# Patient Record
Sex: Male | Born: 1950 | Race: White | Hispanic: No | Marital: Married | Smoking: Never smoker
Health system: Southern US, Community
[De-identification: ages and names within clinical notes are randomized; demographics above are authoritative.]

## PROBLEM LIST (undated history)

## (undated) DIAGNOSIS — C6290 Malignant neoplasm of unspecified testis, unspecified whether descended or undescended: Secondary | ICD-10-CM

## (undated) DIAGNOSIS — N200 Calculus of kidney: Secondary | ICD-10-CM

## (undated) DIAGNOSIS — S86019A Strain of unspecified Achilles tendon, initial encounter: Secondary | ICD-10-CM

## (undated) DIAGNOSIS — C7800 Secondary malignant neoplasm of unspecified lung: Secondary | ICD-10-CM

## (undated) DIAGNOSIS — D1803 Hemangioma of intra-abdominal structures: Secondary | ICD-10-CM

## (undated) DIAGNOSIS — K5792 Diverticulitis of intestine, part unspecified, without perforation or abscess without bleeding: Secondary | ICD-10-CM

## (undated) DIAGNOSIS — N189 Chronic kidney disease, unspecified: Secondary | ICD-10-CM

## (undated) DIAGNOSIS — C4491 Basal cell carcinoma of skin, unspecified: Secondary | ICD-10-CM

## (undated) DIAGNOSIS — C801 Malignant (primary) neoplasm, unspecified: Secondary | ICD-10-CM

## (undated) DIAGNOSIS — Z923 Personal history of irradiation: Secondary | ICD-10-CM

## (undated) DIAGNOSIS — K635 Polyp of colon: Secondary | ICD-10-CM

## (undated) HISTORY — DX: Strain of unspecified achilles tendon, initial encounter: S86.019A

## (undated) HISTORY — DX: Calculus of kidney: N20.0

## (undated) HISTORY — DX: Malignant neoplasm of unspecified testis, unspecified whether descended or undescended: C62.90

## (undated) HISTORY — PX: HERNIA REPAIR: SHX51

## (undated) HISTORY — DX: Polyp of colon: K63.5

## (undated) HISTORY — DX: Chronic kidney disease, unspecified: N18.9

## (undated) HISTORY — DX: Secondary malignant neoplasm of unspecified lung: C78.00

## (undated) HISTORY — DX: Diverticulitis of intestine, part unspecified, without perforation or abscess without bleeding: K57.92

## (undated) HISTORY — DX: Personal history of irradiation: Z92.3

## (undated) HISTORY — DX: Hemangioma of intra-abdominal structures: D18.03

## (undated) HISTORY — DX: Basal cell carcinoma of skin, unspecified: C44.91

---

## 1971-04-01 DIAGNOSIS — Z923 Personal history of irradiation: Secondary | ICD-10-CM

## 1971-04-01 DIAGNOSIS — C6290 Malignant neoplasm of unspecified testis, unspecified whether descended or undescended: Secondary | ICD-10-CM

## 1971-04-01 HISTORY — PX: ORCHIECTOMY: SHX2116

## 1971-04-01 HISTORY — DX: Personal history of irradiation: Z92.3

## 1971-04-01 HISTORY — DX: Malignant neoplasm of unspecified testis, unspecified whether descended or undescended: C62.90

## 1997-11-10 ENCOUNTER — Ambulatory Visit (HOSPITAL_COMMUNITY): Admission: RE | Admit: 1997-11-10 | Discharge: 1997-11-10 | Payer: Self-pay | Admitting: Internal Medicine

## 2001-03-31 HISTORY — PX: OTHER SURGICAL HISTORY: SHX169

## 2002-03-31 DIAGNOSIS — N189 Chronic kidney disease, unspecified: Secondary | ICD-10-CM

## 2002-03-31 HISTORY — DX: Chronic kidney disease, unspecified: N18.9

## 2003-03-21 ENCOUNTER — Encounter: Admission: RE | Admit: 2003-03-21 | Discharge: 2003-03-21 | Payer: Self-pay | Admitting: Urology

## 2004-02-29 ENCOUNTER — Ambulatory Visit: Payer: Self-pay | Admitting: Internal Medicine

## 2004-03-11 ENCOUNTER — Ambulatory Visit: Payer: Self-pay | Admitting: Internal Medicine

## 2004-04-04 ENCOUNTER — Ambulatory Visit: Payer: Self-pay | Admitting: Internal Medicine

## 2004-05-14 ENCOUNTER — Ambulatory Visit: Payer: Self-pay | Admitting: Internal Medicine

## 2004-06-04 ENCOUNTER — Ambulatory Visit: Payer: Self-pay | Admitting: Internal Medicine

## 2004-10-29 ENCOUNTER — Ambulatory Visit (HOSPITAL_COMMUNITY): Admission: RE | Admit: 2004-10-29 | Discharge: 2004-10-29 | Payer: Self-pay | Admitting: Neurosurgery

## 2004-11-20 ENCOUNTER — Ambulatory Visit: Payer: Self-pay | Admitting: Internal Medicine

## 2005-03-28 ENCOUNTER — Ambulatory Visit: Payer: Self-pay | Admitting: Internal Medicine

## 2005-04-16 ENCOUNTER — Ambulatory Visit: Payer: Self-pay | Admitting: Internal Medicine

## 2005-04-24 ENCOUNTER — Ambulatory Visit: Payer: Self-pay | Admitting: Internal Medicine

## 2005-05-14 ENCOUNTER — Ambulatory Visit: Payer: Self-pay | Admitting: Internal Medicine

## 2005-11-26 ENCOUNTER — Ambulatory Visit: Payer: Self-pay | Admitting: Internal Medicine

## 2006-04-07 ENCOUNTER — Ambulatory Visit: Payer: Self-pay | Admitting: Sports Medicine

## 2006-04-29 ENCOUNTER — Ambulatory Visit: Payer: Self-pay | Admitting: Internal Medicine

## 2006-09-16 DIAGNOSIS — Z860100 Personal history of colon polyps, unspecified: Secondary | ICD-10-CM | POA: Insufficient documentation

## 2006-09-16 DIAGNOSIS — J45909 Unspecified asthma, uncomplicated: Secondary | ICD-10-CM | POA: Insufficient documentation

## 2006-09-16 DIAGNOSIS — E785 Hyperlipidemia, unspecified: Secondary | ICD-10-CM

## 2006-09-16 DIAGNOSIS — J309 Allergic rhinitis, unspecified: Secondary | ICD-10-CM | POA: Insufficient documentation

## 2006-09-26 ENCOUNTER — Emergency Department (HOSPITAL_COMMUNITY): Admission: EM | Admit: 2006-09-26 | Discharge: 2006-09-26 | Payer: Self-pay | Admitting: Emergency Medicine

## 2006-10-13 ENCOUNTER — Ambulatory Visit: Payer: Self-pay | Admitting: Internal Medicine

## 2007-04-12 ENCOUNTER — Telehealth (INDEPENDENT_AMBULATORY_CARE_PROVIDER_SITE_OTHER): Payer: Self-pay | Admitting: *Deleted

## 2007-08-24 ENCOUNTER — Ambulatory Visit: Payer: Self-pay | Admitting: Internal Medicine

## 2007-08-24 ENCOUNTER — Encounter (INDEPENDENT_AMBULATORY_CARE_PROVIDER_SITE_OTHER): Payer: Self-pay | Admitting: *Deleted

## 2007-08-24 DIAGNOSIS — G609 Hereditary and idiopathic neuropathy, unspecified: Secondary | ICD-10-CM | POA: Insufficient documentation

## 2007-08-24 LAB — CONVERTED CEMR LAB
Cholesterol, target level: 200 mg/dL
LDL Goal: 90 mg/dL

## 2007-08-29 LAB — CONVERTED CEMR LAB
ALT: 42 units/L (ref 0–53)
Albumin: 4.2 g/dL (ref 3.5–5.2)
Alkaline Phosphatase: 53 units/L (ref 39–117)
Basophils Absolute: 0 10*3/uL (ref 0.0–0.1)
Basophils Relative: 0.9 % (ref 0.0–1.0)
Calcium: 9.8 mg/dL (ref 8.4–10.5)
Chloride: 107 meq/L (ref 96–112)
HDL: 54.7 mg/dL (ref >39.0)
Hemoglobin: 14.1 g/dL (ref 13.0–17.0)
LDL Cholesterol: 116 mg/dL — ABNORMAL HIGH (ref 0–99)
Lymphocytes Relative: 22.3 % (ref 12.0–46.0)
MCV: 92.1 fL (ref 78.0–100.0)
Monocytes Absolute: 0.5 10*3/uL (ref 0.1–1.0)
Monocytes Relative: 10.1 % (ref 3.0–12.0)
PSA: 0.52 ng/mL (ref 0.10–4.00)
Potassium: 4.1 meq/L (ref 3.5–5.1)
RBC: 4.53 M/uL (ref 4.22–5.81)
TSH: 1.45 microintl units/mL (ref 0.35–5.50)
Total CHOL/HDL Ratio: 3.5
Total Protein: 7.1 g/dL (ref 6.0–8.3)
Triglycerides: 111 mg/dL (ref 0–149)
VLDL: 22 mg/dL (ref 0–40)
WBC: 5 10*3/uL (ref 4.5–10.5)

## 2007-08-30 ENCOUNTER — Encounter (INDEPENDENT_AMBULATORY_CARE_PROVIDER_SITE_OTHER): Payer: Self-pay | Admitting: *Deleted

## 2007-08-31 ENCOUNTER — Encounter: Payer: Self-pay | Admitting: Gastroenterology

## 2007-10-22 ENCOUNTER — Ambulatory Visit: Payer: Self-pay | Admitting: Internal Medicine

## 2007-10-22 DIAGNOSIS — F411 Generalized anxiety disorder: Secondary | ICD-10-CM | POA: Insufficient documentation

## 2007-11-18 ENCOUNTER — Telehealth: Payer: Self-pay | Admitting: Internal Medicine

## 2008-06-20 ENCOUNTER — Ambulatory Visit: Payer: Self-pay | Admitting: Gastroenterology

## 2008-07-04 ENCOUNTER — Encounter: Payer: Self-pay | Admitting: Gastroenterology

## 2008-07-04 ENCOUNTER — Ambulatory Visit: Payer: Self-pay | Admitting: Gastroenterology

## 2008-07-06 ENCOUNTER — Encounter: Payer: Self-pay | Admitting: Gastroenterology

## 2008-10-17 ENCOUNTER — Ambulatory Visit: Payer: Self-pay | Admitting: Internal Medicine

## 2008-10-17 DIAGNOSIS — Z85828 Personal history of other malignant neoplasm of skin: Secondary | ICD-10-CM | POA: Insufficient documentation

## 2008-10-17 DIAGNOSIS — K5730 Diverticulosis of large intestine without perforation or abscess without bleeding: Secondary | ICD-10-CM | POA: Insufficient documentation

## 2008-10-17 DIAGNOSIS — C6290 Malignant neoplasm of unspecified testis, unspecified whether descended or undescended: Secondary | ICD-10-CM

## 2008-10-17 LAB — CONVERTED CEMR LAB: LDL Goal: 100 mg/dL

## 2008-10-22 LAB — CONVERTED CEMR LAB
AST: 35 units/L (ref 0–37)
Albumin: 4.1 g/dL (ref 3.5–5.2)
CO2: 29 meq/L (ref 19–32)
Chloride: 105 meq/L (ref 96–112)
Glucose, Bld: 97 mg/dL (ref 70–99)
HDL: 54.3 mg/dL (ref >39.00)
Lymphocytes Relative: 23.9 % (ref 12.0–46.0)
Lymphs Abs: 1.4 10*3/uL (ref 0.7–4.0)
MCV: 91.2 fL (ref 78.0–100.0)
Neutro Abs: 3.2 10*3/uL (ref 1.4–7.7)
Neutrophils Relative %: 54.8 % (ref 43.0–77.0)
PSA: 0.45 ng/mL (ref 0.10–4.00)
Sodium: 140 meq/L (ref 135–145)
TSH: 2.36 microintl units/mL (ref 0.35–5.50)
Total CHOL/HDL Ratio: 3
WBC: 6 10*3/uL (ref 4.5–10.5)

## 2008-10-23 ENCOUNTER — Encounter (INDEPENDENT_AMBULATORY_CARE_PROVIDER_SITE_OTHER): Payer: Self-pay | Admitting: *Deleted

## 2009-02-13 ENCOUNTER — Encounter: Payer: Self-pay | Admitting: Internal Medicine

## 2009-10-18 ENCOUNTER — Ambulatory Visit: Payer: Self-pay | Admitting: Internal Medicine

## 2009-10-18 DIAGNOSIS — D3705 Neoplasm of uncertain behavior of pharynx: Secondary | ICD-10-CM

## 2009-10-18 DIAGNOSIS — D3709 Neoplasm of uncertain behavior of other specified sites of the oral cavity: Secondary | ICD-10-CM

## 2009-10-18 DIAGNOSIS — D3701 Neoplasm of uncertain behavior of lip: Secondary | ICD-10-CM | POA: Insufficient documentation

## 2009-10-19 ENCOUNTER — Telehealth (INDEPENDENT_AMBULATORY_CARE_PROVIDER_SITE_OTHER): Payer: Self-pay | Admitting: *Deleted

## 2009-10-19 LAB — CONVERTED CEMR LAB
ALT: 73 units/L — ABNORMAL HIGH (ref 0–53)
AST: 186 units/L — ABNORMAL HIGH (ref 0–37)
Albumin: 4.6 g/dL (ref 3.5–5.2)
BUN: 13 mg/dL (ref 6–23)
Bilirubin, Direct: 0.2 mg/dL (ref 0.0–0.3)
CO2: 29 meq/L (ref 19–32)
Chloride: 105 meq/L (ref 96–112)
GFR calc non Af Amer: 101.67 mL/min (ref >60)
Glucose, Bld: 97 mg/dL (ref 70–99)
HDL: 59.9 mg/dL (ref >39.00)
Hemoglobin: 14.8 g/dL (ref 13.0–17.0)
Lymphs Abs: 1.4 10*3/uL (ref 0.7–4.0)
Monocytes Relative: 10.1 % (ref 3.0–12.0)
Neutro Abs: 3.2 10*3/uL (ref 1.4–7.7)
Neutrophils Relative %: 55.4 % (ref 43.0–77.0)
PSA: 0.52 ng/mL (ref 0.10–4.00)
Platelets: 220 10*3/uL (ref 150.0–400.0)
RBC: 4.57 M/uL (ref 4.22–5.81)
Sodium: 142 meq/L (ref 135–145)
Triglycerides: 101 mg/dL (ref 0.0–149.0)
VLDL: 20.2 mg/dL (ref 0.0–40.0)

## 2009-10-24 ENCOUNTER — Encounter: Payer: Self-pay | Admitting: Internal Medicine

## 2009-10-25 ENCOUNTER — Ambulatory Visit: Payer: Self-pay | Admitting: Internal Medicine

## 2009-10-26 LAB — CONVERTED CEMR LAB
ALT: 48 units/L (ref 0–53)
Albumin: 4.2 g/dL (ref 3.5–5.2)
Total Bilirubin: 0.9 mg/dL (ref 0.3–1.2)

## 2009-11-06 DIAGNOSIS — C801 Malignant (primary) neoplasm, unspecified: Secondary | ICD-10-CM

## 2009-11-06 HISTORY — DX: Malignant (primary) neoplasm, unspecified: C80.1

## 2009-11-15 ENCOUNTER — Ambulatory Visit (HOSPITAL_COMMUNITY): Admission: RE | Admit: 2009-11-15 | Discharge: 2009-11-15 | Payer: Self-pay | Admitting: Otolaryngology

## 2009-11-26 ENCOUNTER — Encounter: Payer: Self-pay | Admitting: Internal Medicine

## 2009-11-26 ENCOUNTER — Ambulatory Visit
Admission: RE | Admit: 2009-11-26 | Discharge: 2009-12-24 | Payer: Self-pay | Source: Home / Self Care | Admitting: Radiation Oncology

## 2009-11-28 ENCOUNTER — Encounter: Payer: Self-pay | Admitting: Internal Medicine

## 2009-11-29 ENCOUNTER — Encounter: Admission: AD | Admit: 2009-11-29 | Discharge: 2009-11-29 | Payer: Self-pay | Admitting: Dentistry

## 2009-11-29 ENCOUNTER — Ambulatory Visit: Payer: Self-pay | Admitting: Dentistry

## 2009-11-30 ENCOUNTER — Ambulatory Visit: Payer: Self-pay | Admitting: Oncology

## 2009-11-30 ENCOUNTER — Encounter: Payer: Self-pay | Admitting: Internal Medicine

## 2009-11-30 LAB — COMPREHENSIVE METABOLIC PANEL
ALT: 26 U/L (ref 0–53)
AST: 22 U/L (ref 0–37)
Albumin: 4.1 g/dL (ref 3.5–5.2)
Alkaline Phosphatase: 57 U/L (ref 39–117)
BUN: 13 mg/dL (ref 6–23)
Chloride: 105 mEq/L (ref 96–112)
Glucose, Bld: 103 mg/dL — ABNORMAL HIGH (ref 70–99)
Sodium: 141 mEq/L (ref 135–145)
Total Bilirubin: 0.5 mg/dL (ref 0.3–1.2)

## 2009-11-30 LAB — CBC WITH DIFFERENTIAL/PLATELET
Eosinophils Absolute: 0.7 10*3/uL — ABNORMAL HIGH (ref 0.0–0.5)
HGB: 14.3 g/dL (ref 13.0–17.1)
LYMPH%: 25.7 % (ref 14.0–49.0)
NEUT#: 2.9 10*3/uL (ref 1.5–6.5)
Platelets: 227 10*3/uL (ref 140–400)
RDW: 12.8 % (ref 11.0–14.6)

## 2009-12-18 LAB — COMPREHENSIVE METABOLIC PANEL
ALT: 33 U/L (ref 0–53)
Albumin: 4.2 g/dL (ref 3.5–5.2)
Alkaline Phosphatase: 60 U/L (ref 39–117)
Potassium: 4.2 mEq/L (ref 3.5–5.3)
Sodium: 138 mEq/L (ref 135–145)
Total Protein: 7.5 g/dL (ref 6.0–8.3)

## 2009-12-18 LAB — CBC WITH DIFFERENTIAL/PLATELET
BASO%: 0.9 % (ref 0.0–2.0)
Basophils Absolute: 0.1 10*3/uL (ref 0.0–0.1)
HCT: 43.8 % (ref 38.4–49.9)
HGB: 15.1 g/dL (ref 13.0–17.1)
MCHC: 34.5 g/dL (ref 32.0–36.0)
MCV: 89.4 fL (ref 79.3–98.0)
MONO%: 10.9 % (ref 0.0–14.0)
NEUT#: 2.7 10*3/uL (ref 1.5–6.5)
Platelets: 187 10*3/uL (ref 140–400)
RBC: 4.9 10*6/uL (ref 4.20–5.82)
lymph#: 1.6 10*3/uL (ref 0.9–3.3)

## 2009-12-18 LAB — MAGNESIUM: Magnesium: 2.2 mg/dL (ref 1.5–2.5)

## 2009-12-25 ENCOUNTER — Ambulatory Visit: Admission: RE | Admit: 2009-12-25 | Discharge: 2010-02-07 | Payer: Self-pay | Admitting: Radiation Oncology

## 2009-12-26 LAB — BASIC METABOLIC PANEL
BUN: 20 mg/dL (ref 6–23)
Creatinine, Ser: 1.26 mg/dL (ref 0.40–1.50)
Sodium: 131 mEq/L — ABNORMAL LOW (ref 135–145)

## 2009-12-31 ENCOUNTER — Ambulatory Visit: Payer: Self-pay | Admitting: Oncology

## 2009-12-31 ENCOUNTER — Ambulatory Visit (HOSPITAL_COMMUNITY): Admission: RE | Admit: 2009-12-31 | Discharge: 2009-12-31 | Payer: Self-pay | Admitting: Radiation Oncology

## 2010-01-01 LAB — BASIC METABOLIC PANEL
CO2: 26 mEq/L (ref 19–32)
Calcium: 8.8 mg/dL (ref 8.4–10.5)
Glucose, Bld: 100 mg/dL — ABNORMAL HIGH (ref 70–99)
Potassium: 4.1 mEq/L (ref 3.5–5.3)

## 2010-01-08 LAB — CBC WITH DIFFERENTIAL/PLATELET
EOS%: 5.9 % (ref 0.0–7.0)
HCT: 34.5 % — ABNORMAL LOW (ref 38.4–49.9)
LYMPH%: 20.2 % (ref 14.0–49.0)
MCHC: 35.3 g/dL (ref 32.0–36.0)
MCV: 88.2 fL (ref 79.3–98.0)
MONO#: 0.3 10*3/uL (ref 0.1–0.9)
MONO%: 18.2 % — ABNORMAL HIGH (ref 0.0–14.0)
NEUT#: 0.9 10*3/uL — ABNORMAL LOW (ref 1.5–6.5)
Platelets: 208 10*3/uL (ref 140–400)
WBC: 1.7 10*3/uL — ABNORMAL LOW (ref 4.0–10.3)

## 2010-01-08 LAB — MAGNESIUM: Magnesium: 1.8 mg/dL (ref 1.5–2.5)

## 2010-01-08 LAB — COMPREHENSIVE METABOLIC PANEL
BUN: 11 mg/dL (ref 6–23)
CO2: 27 mEq/L (ref 19–32)
Glucose, Bld: 93 mg/dL (ref 70–99)
Sodium: 136 mEq/L (ref 135–145)
Total Bilirubin: 0.5 mg/dL (ref 0.3–1.2)
Total Protein: 6.5 g/dL (ref 6.0–8.3)

## 2010-01-10 ENCOUNTER — Encounter
Admission: RE | Admit: 2010-01-10 | Discharge: 2010-03-27 | Payer: Self-pay | Source: Home / Self Care | Attending: Radiation Oncology | Admitting: Radiation Oncology

## 2010-01-15 LAB — CBC WITH DIFFERENTIAL/PLATELET
BASO%: 0.5 % (ref 0.0–2.0)
Basophils Absolute: 0 10*3/uL (ref 0.0–0.1)
EOS%: 2.2 % (ref 0.0–7.0)
Eosinophils Absolute: 0.1 10*3/uL (ref 0.0–0.5)
HGB: 12.2 g/dL — ABNORMAL LOW (ref 13.0–17.1)
MCHC: 35.8 g/dL (ref 32.0–36.0)
MONO#: 0.4 10*3/uL (ref 0.1–0.9)
MONO%: 9.8 % (ref 0.0–14.0)
NEUT%: 78.7 % — ABNORMAL HIGH (ref 39.0–75.0)
Platelets: 225 10*3/uL (ref 140–400)
RDW: 12.3 % (ref 11.0–14.6)

## 2010-01-15 LAB — COMPREHENSIVE METABOLIC PANEL
ALT: 27 U/L (ref 0–53)
Albumin: 3.6 g/dL (ref 3.5–5.2)
BUN: 11 mg/dL (ref 6–23)
Calcium: 9.6 mg/dL (ref 8.4–10.5)
Creatinine, Ser: 0.88 mg/dL (ref 0.40–1.50)
Glucose, Bld: 120 mg/dL — ABNORMAL HIGH (ref 70–99)
Potassium: 4.3 mEq/L (ref 3.5–5.3)
Sodium: 137 mEq/L (ref 135–145)

## 2010-01-21 LAB — BASIC METABOLIC PANEL
CO2: 27 mEq/L (ref 19–32)
Calcium: 9.5 mg/dL (ref 8.4–10.5)
Chloride: 98 mEq/L (ref 96–112)

## 2010-01-21 LAB — MAGNESIUM: Magnesium: 2 mg/dL (ref 1.5–2.5)

## 2010-01-29 LAB — BASIC METABOLIC PANEL
BUN: 13 mg/dL (ref 6–23)
CO2: 30 mEq/L (ref 19–32)
Chloride: 98 mEq/L (ref 96–112)
Creatinine, Ser: 0.82 mg/dL (ref 0.40–1.50)
Glucose, Bld: 102 mg/dL — ABNORMAL HIGH (ref 70–99)
Sodium: 137 mEq/L (ref 135–145)

## 2010-01-30 ENCOUNTER — Ambulatory Visit: Payer: Self-pay | Admitting: Oncology

## 2010-02-05 LAB — COMPREHENSIVE METABOLIC PANEL
AST: 15 U/L (ref 0–37)
CO2: 30 mEq/L (ref 19–32)
Calcium: 9.3 mg/dL (ref 8.4–10.5)
Chloride: 97 mEq/L (ref 96–112)
Creatinine, Ser: 0.83 mg/dL (ref 0.40–1.50)
Total Bilirubin: 0.3 mg/dL (ref 0.3–1.2)

## 2010-02-05 LAB — CBC WITH DIFFERENTIAL/PLATELET
EOS%: 3.3 % (ref 0.0–7.0)
HCT: 27.7 % — ABNORMAL LOW (ref 38.4–49.9)
HGB: 9.9 g/dL — ABNORMAL LOW (ref 13.0–17.1)
LYMPH%: 8.3 % — ABNORMAL LOW (ref 14.0–49.0)
MCHC: 35.8 g/dL (ref 32.0–36.0)
MONO#: 0.3 10*3/uL (ref 0.1–0.9)
NEUT%: 65.4 % (ref 39.0–75.0)
WBC: 1.5 10*3/uL — ABNORMAL LOW (ref 4.0–10.3)

## 2010-02-05 LAB — MAGNESIUM: Magnesium: 2 mg/dL (ref 1.5–2.5)

## 2010-02-11 ENCOUNTER — Ambulatory Visit
Admission: RE | Admit: 2010-02-11 | Discharge: 2010-02-11 | Payer: Self-pay | Source: Home / Self Care | Admitting: Radiation Oncology

## 2010-02-19 ENCOUNTER — Encounter: Payer: Self-pay | Admitting: Internal Medicine

## 2010-03-04 ENCOUNTER — Ambulatory Visit (HOSPITAL_COMMUNITY)
Admission: RE | Admit: 2010-03-04 | Discharge: 2010-03-04 | Payer: Self-pay | Source: Home / Self Care | Admitting: Radiation Oncology

## 2010-03-04 ENCOUNTER — Ambulatory Visit: Payer: Self-pay | Admitting: Oncology

## 2010-03-06 LAB — COMPREHENSIVE METABOLIC PANEL
ALT: 16 U/L (ref 0–53)
Albumin: 4.1 g/dL (ref 3.5–5.2)
Alkaline Phosphatase: 61 U/L (ref 39–117)
BUN: 17 mg/dL (ref 6–23)
CO2: 31 mEq/L (ref 19–32)
Calcium: 9.6 mg/dL (ref 8.4–10.5)
Creatinine, Ser: 0.81 mg/dL (ref 0.40–1.50)
Sodium: 137 mEq/L (ref 135–145)

## 2010-03-06 LAB — CBC WITH DIFFERENTIAL/PLATELET
EOS%: 2.6 % (ref 0.0–7.0)
Eosinophils Absolute: 0.1 10*3/uL (ref 0.0–0.5)
MCH: 33.1 pg (ref 27.2–33.4)
MCHC: 35.5 g/dL (ref 32.0–36.0)
MCV: 93.3 fL (ref 79.3–98.0)
Platelets: 286 10*3/uL (ref 140–400)
RDW: 16.3 % — ABNORMAL HIGH (ref 11.0–14.6)
lymph#: 0.4 10*3/uL — ABNORMAL LOW (ref 0.9–3.3)

## 2010-03-07 ENCOUNTER — Encounter: Payer: Self-pay | Admitting: Internal Medicine

## 2010-04-08 ENCOUNTER — Ambulatory Visit (HOSPITAL_BASED_OUTPATIENT_CLINIC_OR_DEPARTMENT_OTHER): Payer: BC Managed Care – PPO | Admitting: Oncology

## 2010-04-10 LAB — CBC WITH DIFFERENTIAL/PLATELET
BASO%: 0.4 % (ref 0.0–2.0)
Basophils Absolute: 0 10*3/uL (ref 0.0–0.1)
EOS%: 6 % (ref 0.0–7.0)
Eosinophils Absolute: 0.3 10*3/uL (ref 0.0–0.5)
HCT: 32.1 % — ABNORMAL LOW (ref 38.4–49.9)
HGB: 11 g/dL — ABNORMAL LOW (ref 13.0–17.1)
LYMPH%: 13.8 % — ABNORMAL LOW (ref 14.0–49.0)
MCH: 31.6 pg (ref 27.2–33.4)
MCHC: 34.3 g/dL (ref 32.0–36.0)
MCV: 92.2 fL (ref 79.3–98.0)
MONO#: 0.4 10*3/uL (ref 0.1–0.9)
MONO%: 7.5 % (ref 0.0–14.0)
NEUT#: 3.4 10*3/uL (ref 1.5–6.5)
NEUT%: 72.3 % (ref 39.0–75.0)
Platelets: 165 10*3/uL (ref 140–400)
RBC: 3.48 10*6/uL — ABNORMAL LOW (ref 4.20–5.82)
RDW: 12.5 % (ref 11.0–14.6)
WBC: 4.7 10*3/uL (ref 4.0–10.3)
lymph#: 0.6 10*3/uL — ABNORMAL LOW (ref 0.9–3.3)

## 2010-04-10 LAB — COMPREHENSIVE METABOLIC PANEL
ALT: 20 U/L (ref 0–53)
AST: 16 U/L (ref 0–37)
Albumin: 4.5 g/dL (ref 3.5–5.2)
Alkaline Phosphatase: 47 U/L (ref 39–117)
BUN: 13 mg/dL (ref 6–23)
CO2: 27 mEq/L (ref 19–32)
Calcium: 10.1 mg/dL (ref 8.4–10.5)
Chloride: 101 mEq/L (ref 96–112)
Creatinine, Ser: 0.81 mg/dL (ref 0.40–1.50)
Glucose, Bld: 106 mg/dL — ABNORMAL HIGH (ref 70–99)
Potassium: 4.9 mEq/L (ref 3.5–5.3)
Sodium: 138 mEq/L (ref 135–145)
Total Bilirubin: 0.4 mg/dL (ref 0.3–1.2)
Total Protein: 6.5 g/dL (ref 6.0–8.3)

## 2010-04-11 ENCOUNTER — Encounter: Payer: Self-pay | Admitting: Internal Medicine

## 2010-04-18 ENCOUNTER — Other Ambulatory Visit: Payer: Self-pay | Admitting: Oncology

## 2010-04-18 DIAGNOSIS — C099 Malignant neoplasm of tonsil, unspecified: Secondary | ICD-10-CM

## 2010-04-23 ENCOUNTER — Encounter
Admission: RE | Admit: 2010-04-23 | Discharge: 2010-04-30 | Payer: Self-pay | Source: Home / Self Care | Attending: Radiation Oncology | Admitting: Radiation Oncology

## 2010-04-30 NOTE — Letter (Signed)
Summary: [facility]  [facility]   Imported By: Lanelle Bal 12/19/2009 10:02:58  _____________________________________________________________________  External Attachment:    Type:   Image     Comment:   External Document

## 2010-04-30 NOTE — Assessment & Plan Note (Signed)
Summary: cpx/kdc   Vital Signs:  Patient profile:   60 year old male Height:      69.5 inches Weight:      200.6 pounds BMI:     29.30 Temp:     98.3 degrees F oral Pulse rate:   60 / minute Resp:     14 per minute BP sitting:   118 / 80  (left arm) Cuff size:   large  Vitals Entered By: Shonna Chock CMA (October 18, 2009 9:55 AM)  CC: Lipid Management   CC:  Lipid Management.  History of Present Illness: Mr. Gary Sexton is here for a physical; he does have a sore throat over 4 weeks.    The patient denies nasal congestion, purulent nasal discharge, productive cough, or  earache @ present.  The patient denies fever, dyspnea, wheezing, rash, vomiting, and diarrhea.  The patient also reports perennial  itchy watery eyes and sneezing.  The patient denies  frontal headache, muscle aches, or  severe fatigue.  Risk factors for Strep sinusitis include L  tender cervical  adenopathy.  The patient denies the following risk factors for Strep sinusitis: facial pain  or  tooth pain.  Rx: none to date.  Lipid Management History:      Positive NCEP/ATP III risk factors include male age 41 years old or older and family history for ischemic heart disease (males less than 81 years old).  Negative NCEP/ATP III risk factors include non-diabetic, non-tobacco-user status, non-hypertensive, no ASHD (atherosclerotic heart disease), no prior stroke/TIA, no peripheral vascular disease, and no history of aortic aneurysm.     Preventive Screening-Counseling & Management  Caffeine-Diet-Exercise     Does Patient Exercise: yes  Current Medications (verified): 1)  Vytorin 10-20 Mg  Tabs (Ezetimibe-Simvastatin) .... At Bedtime 2)  Advair Diskus 100-50 Mcg/dose  Misc (Fluticasone-Salmeterol) .... As Directed 3)  Multivitamin 4)  Asa 325mg  .... Alternates With 1/2 Tab 5)  Cialis 20 Mg  Tabs (Tadalafil) .... Take One As Directed Prn 6)  Fluticasone Propionate 50 Mcg/act  Susp (Fluticasone Propionate) .Marland Kitchen.. 1 Spray Two  Times A Day Prn  Allergies (verified): No Known Drug Allergies  Past History:  Past Medical History: Allergic rhinitis Asthma Colonic polyps,PMH  of Hyperlipidemia: NMR Lipoprofile:LDL 130(1812/1180), HDL 57, TG 101. LDL goal = < 100. Framingham Study LDL goal = < 130. 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 adenomatous , negative  2005 & 2010, hyperplastic polyp , Dr Russella Dar , due 2020 Inguinal herniorrhaphy, bilateral Renal calculi, S/P cystoscopy  Family History: Father: CVA @ 3; MI pre 55,CHF Mother: d [place]'s, breast  cancer Siblings: bro: thoracic aneurysm; bro: skin cancer; MGF: lung  cancer  Social History: Never Smoked No diet Married Alcohol use-yes:socially Occupation:Director of R&D Regular exercise-yes: aerobics 2-3 X/week, weights  Review of Systems General:  Denies chills and sweats. Eyes:  Denies discharge, eye pain, and red eye. ENT:  Denies difficulty swallowing and hoarseness; see HPI. Tinnitus & slight hearing loss post flight to [place].. CV:  Denies chest pain or discomfort, leg cramps with exertion, palpitations, shortness  of breath with exertion, swelling of feet, and swelling of hands. Resp:  Denies chest pain with inspiration and sputum productive. GI:  Denies abdominal pain, bloody stools, constipation, dark tarry stools, diarrhea, and indigestion. GU:  Denies discharge, dysuria, and hematuria. MS:  Denies low back pain, mid back pain, and thoracic pain; See PMH. Derm:  Denies  changes in nail beds, dryness, and hair loss. Neuro:  Numbness RLE ,?  lumbar radiculopathy. Evaluated @ [facility]. Psych:  Denies anxiety and depression. Endo:  Denies cold intolerance, excessive hunger, excessive thirst, excessive urination, and heat intolerance. Heme:  Denies abnormal bruising and bleeding.  Physical Exam  General:  well-nourished,in no acute distress; alert,appropriate and cooperative throughout examination Head:  Normocephalic and atraumatic without obvious abnormalities. No apparent alopecia  Eyes:  No corneal or conjunctival inflammation noted.  Perrla. Funduscopic exam benign, without hemorrhages, exudates or papilledema.  Ears:  External ear exam shows no significant lesions or deformities.  Otoscopic examination reveals clear canals, tympanic membranes are intact bilaterally without bulging, retraction, inflammation or discharge. Hearing is grossly normal bilaterally. R TM dull Nose:  External nasal examination shows no deformity or inflammation. Nasal mucosa are pink and moist without lesions or exudates. Mouth:   Teeth in good repair. L tonsil hypertropied with ? papular changes.   Neck:  No deformities, masses, or tenderness noted. Lungs:  Normal respiratory effort, chest expands symmetrically. Lungs are clear to auscultation, no crackles or wheezes. Heart:  regular rhythm, no murmur, no gallop, no rub, no JVD, no HJR, and bradycardia.   Abdomen:  Bowel sounds positive,abdomen soft and non-tender without masses, organomegaly or hernias noted. Op sites healing Rectal:  External  hemorrhoidal tags  noted. Normal sphincter tone. No rectal masses or tenderness. R buttocks atrophy Genitalia:  R testis absent. No scrotal masses or lesions. No penis lesions or urethral discharge. Prostate:  Prostate gland firm and smooth, no enlargement, nodularity, tenderness, mass, asymmetry or induration. Msk:  No deformity or scoliosis noted of thoracic or lumbar spine.   Pulses:  R and L  carotid,radial,dorsalis pedis and posterior tibial pulses are full and equal bilaterally Extremities:  No clubbing, cyanosis, edema. Flexion contractures of toes R foot. Atrophy of R calf Neurologic:  alert & oriented X3 and DTRs symmetrical and normal.   Skin:  Intact without suspicious lesions or rashes ( see abdominal op sites) Cervical Nodes:  Single L submandibular LN Axillary Nodes:  No palpable lymphadenopathy Inguinal Nodes:  No significant adenopathy Psych:  memory intact for recent and remote, normally interactive, and good eye contact.     Impression & Recommendations:  Problem # 1:  ROUTINE GENERAL MEDICAL EXAM@HEALTH  CARE FACL (ICD-V70.0)  Orders: EKG w/ Interpretation (93000) Venipuncture (98119) TLB-Lipid Panel (80061-LIPID) TLB-BMP (Basic Metabolic Panel-BMET) (80048-METABOL) TLB-CBC Platelet - w/Differential (85025-CBCD) TLB-Hepatic/Liver Function Pnl (80076-HEPATIC) TLB-TSH (Thyroid Stimulating Hormone) (84443-TSH) TLB-PSA (Prostate Specific Antigen) (84153-PSA) Specimen Handling (14782)  Problem # 2:  NEOPLASM UNCERTAIN BHV LIP ORAL CAVITY&PHARYNX (ICD-235.1)  Presentation as ST X 4 weeks; single cervical LN. ENT consultation recommended  Orders: ENT Referral (ENT)  Problem # 3:  PERIPHERAL NEUROPATHY (ICD-356.9) ?Lumbar  Problem # 4:  HYPERLIPIDEMIA (ICD-272.4)  His updated medication list for this problem includes:    Vytorin 10-20 Mg Tabs (Ezetimibe-simvastatin) .Marland Kitchen... At bedtime  Complete Medication List: 1)  Vytorin 10-20 Mg Tabs (Ezetimibe-simvastatin) .... At bedtime 2)  Advair Diskus 100-50 Mcg/dose Misc (Fluticasone-salmeterol) .... As directed 3)  Multivitamin  4)  Asa 325mg   .... Alternates with  1/2 tab 5)  Cialis 20 Mg Tabs (Tadalafil) .... Take one as directed prn 6)  Fluticasone Propionate 50 Mcg/act Susp (Fluticasone propionate) .Marland Kitchen.. 1 spray two times a day prn 7)  Amoxicillin-pot Clavulanate 875-125 Mg Tabs (Amoxicillin-pot clavulanate)  .Marland Kitchen.. 1 every 12 hrs with a meal  Lipid Assessment/Plan:      Based on NCEP/ATP III, the patient's risk factor category is "2 or more risk factors and a calculated 10 year CAD risk of < 20%".  The patient's lipid goals are as follows: Total cholesterol goal is 200; LDL cholesterol goal is 100; HDL cholesterol goal is 40; Triglyceride goal is 150.  His LDL cholesterol goal has not been met.  Secondary causes for hyperlipidemia have been ruled out.  He has been counseled on adjunctive measures for lowering his cholesterol and has been provided with dietary instructions.    Patient Instructions: 1)  ENT consult will be scheduled ASAP Prescriptions: AMOXICILLIN-POT CLAVULANATE 875-125 MG TABS (AMOXICILLIN-POT CLAVULANATE) 1 every 12 hrs with a meal  #20 x 0   Entered and Authorized by:   Marga Melnick MD   Signed by:   Marga Melnick MD on 10/18/2009   Method used:   Faxed to ...       [facility] (retail)       [address]       [place], [place]  [address]       Ph: [phone] or [phone]       Fax: [phone]   RxID:   [id number]    Immunization History:  Tetanus/Td Immunization History:    Tetanus/Td:  historical (08/30/2003)

## 2010-04-30 NOTE — Letter (Signed)
Summary: [facility]  [facility]   Imported By: Lanelle Bal 12/13/2009 08:24:59  _____________________________________________________________________  External Attachment:    Type:   Image     Comment:   External Document

## 2010-04-30 NOTE — Letter (Signed)
Summary: [facility]  [facility]   Imported By: Lanelle Bal 12/24/2009 12:43:26  _____________________________________________________________________  External Attachment:    Type:   Image     Comment:   External Document

## 2010-04-30 NOTE — Letter (Signed)
Summary: [facility]  [facility]   Imported By: Lanelle Bal 03/04/2010 13:03:01  _____________________________________________________________________  External Attachment:    Type:   Image     Comment:   External Document

## 2010-04-30 NOTE — Progress Notes (Signed)
Summary: Lab Results  Phone Note Outgoing Call Call back at Home Phone [phone]   Call placed by: Shonna Chock CMA,  October 19, 2009 4:23 PM Call placed to: Patient Summary of Call: Left detailed message on VM with results below:  Stop Vytorin & avoid vitamin A, Tylernol & alcohol. Repeat fasting liver panel in 1 week (790.4). Allergic cell count high , blood count otherwise normal. LDL goal = < 100, but we'll discuss possible cholesterol med changes after liver  & throat issues clarified. ENT consult has been requested. Hopp  Patient to call to schedule appointment to recheck Hepatic profile in 1 week, patient also to call if any questions or concerns (copy of labs mailed).Shonna Chock CMA  October 19, 2009 4:24 PM

## 2010-04-30 NOTE — Consult Note (Signed)
Summary: [facility]  [facility]   Imported By: Lanelle Bal 11/05/2009 10:18:36  _____________________________________________________________________  External Attachment:    Type:   Image     Comment:   External Document

## 2010-05-02 NOTE — Letter (Signed)
Summary: [facility]  [facility]   Imported By: Lanelle Bal 03/21/2010 09:17:59  _____________________________________________________________________  External Attachment:    Type:   Image     Comment:   External Document

## 2010-05-06 ENCOUNTER — Encounter (HOSPITAL_COMMUNITY): Payer: Self-pay

## 2010-05-06 ENCOUNTER — Encounter (HOSPITAL_COMMUNITY)
Admission: RE | Admit: 2010-05-06 | Discharge: 2010-05-06 | Disposition: A | Discharge status: Home / Self Care | Payer: BC Managed Care – PPO | Source: Ambulatory Visit | Attending: Oncology | Admitting: Oncology

## 2010-05-06 DIAGNOSIS — C091 Malignant neoplasm of tonsillar pillar (anterior) (posterior): Secondary | ICD-10-CM | POA: Insufficient documentation

## 2010-05-06 DIAGNOSIS — C099 Malignant neoplasm of tonsil, unspecified: Secondary | ICD-10-CM

## 2010-05-06 DIAGNOSIS — E785 Hyperlipidemia, unspecified: Secondary | ICD-10-CM | POA: Insufficient documentation

## 2010-05-06 HISTORY — DX: Malignant (primary) neoplasm, unspecified: C80.1

## 2010-05-06 LAB — GLUCOSE, CAPILLARY: Glucose-Capillary: 100 mg/dL — ABNORMAL HIGH (ref 70–99)

## 2010-05-06 MED ORDER — FLUDEOXYGLUCOSE F - 18 (FDG) INJECTION: 17.3000 | Freq: Once | INTRAVENOUS | Status: DC | PRN

## 2010-05-08 ENCOUNTER — Encounter (HOSPITAL_BASED_OUTPATIENT_CLINIC_OR_DEPARTMENT_OTHER): Payer: BC Managed Care – PPO | Admitting: Oncology

## 2010-05-08 DIAGNOSIS — T451X5A Adverse effect of antineoplastic and immunosuppressive drugs, initial encounter: Secondary | ICD-10-CM

## 2010-05-08 DIAGNOSIS — C099 Malignant neoplasm of tonsil, unspecified: Secondary | ICD-10-CM

## 2010-05-08 DIAGNOSIS — R5383 Other fatigue: Secondary | ICD-10-CM

## 2010-05-08 DIAGNOSIS — D6481 Anemia due to antineoplastic chemotherapy: Secondary | ICD-10-CM

## 2010-05-08 LAB — CBC WITH DIFFERENTIAL/PLATELET
Basophils Absolute: 0 10*3/uL (ref 0.0–0.1)
EOS%: 4.9 % (ref 0.0–7.0)
HGB: 11.9 g/dL — ABNORMAL LOW (ref 13.0–17.1)
LYMPH%: 11.6 % — ABNORMAL LOW (ref 14.0–49.0)
MCHC: 35.3 g/dL (ref 32.0–36.0)
MCV: 93.3 fL (ref 79.3–98.0)
NEUT#: 2.9 10*3/uL (ref 1.5–6.5)
RDW: 12.3 % (ref 11.0–14.6)
WBC: 3.9 10*3/uL — ABNORMAL LOW (ref 4.0–10.3)

## 2010-05-08 LAB — COMPREHENSIVE METABOLIC PANEL
ALT: 16 U/L (ref 0–53)
Albumin: 4.4 g/dL (ref 3.5–5.2)
Chloride: 102 mEq/L (ref 96–112)
Potassium: 4.4 mEq/L (ref 3.5–5.3)
Sodium: 136 mEq/L (ref 135–145)
Total Bilirubin: 0.4 mg/dL (ref 0.3–1.2)
Total Protein: 6.5 g/dL (ref 6.0–8.3)

## 2010-05-08 NOTE — Letter (Signed)
Summary: [facility]  [facility]   Imported By: Lanelle Bal 04/29/2010 09:40:01  _____________________________________________________________________  External Attachment:    Type:   Image     Comment:   External Document

## 2010-05-22 ENCOUNTER — Ambulatory Visit: Payer: BC Managed Care – PPO | Attending: Radiation Oncology | Admitting: Radiation Oncology

## 2010-05-22 ENCOUNTER — Encounter: Payer: Self-pay | Admitting: Internal Medicine

## 2010-06-04 ENCOUNTER — Ambulatory Visit: Payer: BC Managed Care – PPO | Attending: Oncology

## 2010-06-04 DIAGNOSIS — R1310 Dysphagia, unspecified: Secondary | ICD-10-CM | POA: Insufficient documentation

## 2010-06-04 DIAGNOSIS — IMO0001 Reserved for inherently not codable concepts without codable children: Secondary | ICD-10-CM | POA: Insufficient documentation

## 2010-06-04 DIAGNOSIS — C099 Malignant neoplasm of tonsil, unspecified: Secondary | ICD-10-CM | POA: Insufficient documentation

## 2010-06-06 NOTE — Letter (Signed)
Summary: [facility]  [facility]   Imported By: Maryln Gottron 05/28/2010 12:22:25  _____________________________________________________________________  External Attachment:    Type:   Image     Comment:   External Document

## 2010-06-14 LAB — GLUCOSE, CAPILLARY: Glucose-Capillary: 120 mg/dL — ABNORMAL HIGH (ref 70–99)

## 2010-07-16 ENCOUNTER — Ambulatory Visit: Payer: BC Managed Care – PPO | Attending: Radiation Oncology | Admitting: Radiation Oncology

## 2010-07-18 ENCOUNTER — Telehealth: Payer: Self-pay | Admitting: Internal Medicine

## 2010-07-18 NOTE — Telephone Encounter (Signed)
Patient wants rx for rescue inhaler albuterol - he needs 1 patch of scopolamine for a fishing trip [facility] [address] [place]

## 2010-07-19 MED ORDER — ALBUTEROL SULFATE HFA 108 (90 BASE) MCG/ACT IN AERS: 2.0000 | INHALATION_SPRAY | RESPIRATORY_TRACT | Status: DC | PRN

## 2010-07-19 MED ORDER — SCOPOLAMINE 1 MG/3DAYS TD PT72: 1.0000 | MEDICATED_PATCH | TRANSDERMAL | Status: DC

## 2010-07-19 NOTE — Telephone Encounter (Signed)
Hopp please advise on patient's request for meds, not on med list

## 2010-07-19 NOTE — Telephone Encounter (Signed)
Albuterol metered-dose inhaler 1-2 puffs every 4 hours as needed dispense 1 refill x2. Transderm Scop  patch  OK

## 2010-08-27 ENCOUNTER — Telehealth: Payer: Self-pay | Admitting: Internal Medicine

## 2010-08-27 MED ORDER — BECLOMETHASONE DIPROP MONOHYD 42 MCG/SPRAY NA SUSP: 1.0000 | Freq: Two times a day (BID) | NASAL | Status: DC

## 2010-08-27 NOTE — Telephone Encounter (Signed)
Patient wants new rx for beconase (generic) for hay fever - [facility] - Gary Sexton

## 2010-08-27 NOTE — Telephone Encounter (Signed)
Per Dr.Hopper ok to give patient med, rx sent to pharmacy

## 2010-09-17 ENCOUNTER — Ambulatory Visit
Admission: RE | Admit: 2010-09-17 | Discharge: 2010-09-17 | Disposition: A | Discharge status: Home / Self Care | Payer: BC Managed Care – PPO | Source: Ambulatory Visit | Attending: Radiation Oncology | Admitting: Radiation Oncology

## 2010-09-17 ENCOUNTER — Other Ambulatory Visit: Payer: Self-pay | Admitting: Radiation Oncology

## 2010-09-17 DIAGNOSIS — C099 Malignant neoplasm of tonsil, unspecified: Secondary | ICD-10-CM

## 2010-10-23 ENCOUNTER — Other Ambulatory Visit: Payer: Self-pay | Admitting: Internal Medicine

## 2010-10-23 NOTE — Telephone Encounter (Signed)
Refill advair discus 110/50 -[facility] - Mady Haagensen

## 2010-10-24 MED ORDER — FLUTICASONE-SALMETEROL 100-50 MCG/DOSE IN AEPB: 1.0000 | INHALATION_SPRAY | Freq: Two times a day (BID) | RESPIRATORY_TRACT | Status: DC

## 2010-10-24 NOTE — Telephone Encounter (Signed)
RX sent to pharmacy, patient needs to schedule CPX  

## 2010-11-04 ENCOUNTER — Other Ambulatory Visit: Payer: Self-pay | Admitting: Internal Medicine

## 2010-11-04 ENCOUNTER — Ambulatory Visit
Admission: RE | Admit: 2010-11-04 | Discharge: 2010-11-04 | Disposition: A | Discharge status: Home / Self Care | Payer: BC Managed Care – PPO | Source: Ambulatory Visit | Attending: Radiation Oncology | Admitting: Radiation Oncology

## 2010-11-04 DIAGNOSIS — T451X5A Adverse effect of antineoplastic and immunosuppressive drugs, initial encounter: Secondary | ICD-10-CM

## 2010-11-04 DIAGNOSIS — C099 Malignant neoplasm of tonsil, unspecified: Secondary | ICD-10-CM

## 2010-11-04 DIAGNOSIS — R5383 Other fatigue: Secondary | ICD-10-CM

## 2010-11-06 ENCOUNTER — Encounter (HOSPITAL_COMMUNITY): Payer: Self-pay

## 2010-11-06 ENCOUNTER — Ambulatory Visit (HOSPITAL_COMMUNITY)
Admission: RE | Admit: 2010-11-06 | Discharge: 2010-11-06 | Disposition: A | Discharge status: Home / Self Care | Payer: BC Managed Care – PPO | Source: Ambulatory Visit | Attending: Radiation Oncology | Admitting: Radiation Oncology

## 2010-11-06 ENCOUNTER — Inpatient Hospital Stay (HOSPITAL_COMMUNITY)
Admission: RE | Admit: 2010-11-06 | Discharge: 2010-11-06 | Payer: BC Managed Care – PPO | Source: Ambulatory Visit | Attending: Radiation Oncology | Admitting: Radiation Oncology

## 2010-11-06 DIAGNOSIS — Z923 Personal history of irradiation: Secondary | ICD-10-CM | POA: Insufficient documentation

## 2010-11-06 DIAGNOSIS — K117 Disturbances of salivary secretion: Secondary | ICD-10-CM | POA: Insufficient documentation

## 2010-11-06 DIAGNOSIS — C099 Malignant neoplasm of tonsil, unspecified: Secondary | ICD-10-CM | POA: Insufficient documentation

## 2010-11-06 DIAGNOSIS — Z9221 Personal history of antineoplastic chemotherapy: Secondary | ICD-10-CM | POA: Insufficient documentation

## 2010-11-06 DIAGNOSIS — J3489 Other specified disorders of nose and nasal sinuses: Secondary | ICD-10-CM | POA: Insufficient documentation

## 2010-11-06 MED ORDER — IOHEXOL 300 MG/ML  SOLN
100.0000 mL | Freq: Once | INTRAMUSCULAR | Status: AC | PRN
  Administered 2010-11-06: 100 mL via INTRAVENOUS

## 2010-11-13 ENCOUNTER — Ambulatory Visit
Admission: RE | Admit: 2010-11-13 | Discharge: 2010-11-13 | Disposition: A | Discharge status: Home / Self Care | Payer: BC Managed Care – PPO | Source: Ambulatory Visit | Attending: Radiation Oncology | Admitting: Radiation Oncology

## 2010-11-13 ENCOUNTER — Encounter: Payer: BC Managed Care – PPO | Admitting: Internal Medicine

## 2010-11-27 ENCOUNTER — Encounter: Payer: Self-pay | Admitting: Internal Medicine

## 2010-11-27 ENCOUNTER — Ambulatory Visit (INDEPENDENT_AMBULATORY_CARE_PROVIDER_SITE_OTHER): Payer: BC Managed Care – PPO | Admitting: Internal Medicine

## 2010-11-27 VITALS — BP 130/82 | HR 59 | Temp 98.3°F | Resp 12 | Ht 69.5 in | Wt 161.8 lb

## 2010-11-27 DIAGNOSIS — Z0000 Encounter for general adult medical examination without abnormal findings: Secondary | ICD-10-CM

## 2010-11-27 DIAGNOSIS — E785 Hyperlipidemia, unspecified: Secondary | ICD-10-CM

## 2010-11-27 DIAGNOSIS — N400 Enlarged prostate without lower urinary tract symptoms: Secondary | ICD-10-CM

## 2010-11-27 MED ORDER — FLUTICASONE-SALMETEROL 100-50 MCG/DOSE IN AEPB: 1.0000 | INHALATION_SPRAY | Freq: Two times a day (BID) | RESPIRATORY_TRACT | Status: DC

## 2010-11-27 MED ORDER — TADALAFIL 20 MG PO TABS: 20.0000 mg | ORAL_TABLET | ORAL | Status: AC

## 2010-11-27 NOTE — Progress Notes (Signed)
Subjective:    Patient ID: Gary Sexton, male    DOB: 1951/01/11, 60 y.o.   MRN: [id number]  HPI  Gary Sexton  is here for a physical;acute issues include a recent asthma flare.      Review of Systems His asthma has flared in the last month. He has perennial issues with mold. He's been hesitant to use his inhaled steroids feeling this might compromise his immune system in reference to the upper airway. Cough is nonproductive clear, not purulent sputum. He denies frontal headache, facial pain, nasal purulence, dental pain, or sore throat. 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        Objective:   Physical Exam Gen.: Thin but healthy and well-nourished in appearance. Alert, appropriate and cooperative throughout exam. Head: Normocephalic without obvious abnormalities. Eyes: No corneal or conjunctival inflammation noted. Pupils equal round reactive to light and accommodation. Fundal exam is benign without hemorrhages, exudate, papilledema. Extraocular motion intact. Vision grossly normal with lenses. Ears: External  ear exam reveals no significant lesions or deformities. Canals clear .TMs normal. Hearing is grossly decreased to whisper on R.. Nose: External nasal exam reveals no deformity or inflammation. Nasal mucosa are pink and moist. No lesions or exudates noted. Septum w/o deviation  Mouth: Oral mucosa and oropharynx reveal no lesions or exudates. Teeth in good repair. Neck: No deformities, masses, or tenderness noted. Range of motion & . Thyroid normal. Lungs: Normal respiratory effort; chest expands symmetrically. Lungs :scattered musical , low grade wheezes; no  increased work of breathing. Heart: Slow, regular  rhythm. Normal S1 and S2. No gallop, click, or rub. No  murmur. Abdomen: Bowel sounds normal; abdomen soft and nontender. No masses,  organomegaly or hernias noted. Genitalia/DRE: The right testicle is absent. Varices are noted on the left with a small granuloma. Prostate is upper limits of normal without definite nodule formation or induration.   .                                                                                   Musculoskeletal/extremities: No deformity or scoliosis noted of  the thoracic or lumbar spine. No clubbing, cyanosis, edema noted. Range of motion  normal .Tone & strength  Normal.Joints;minor DIP OA changes. Nail health  good. Vascular: Carotid, radial artery, dorsalis pedis and  posterior tibial pulses are full and equal. No bruits present. Neurologic: Alert and oriented x3. Deep tendon reflexes symmetrical and normal except decreased @ R knee.         Skin: Intact without suspicious lesions or rashes. Lymph: No cervical, axillary, or inguinal lymphadenopathy present. Psych: Mood and affect are normal. Normally interactive  Assessment & Plan:  #1 comprehensive physical exam; no acute findings #2 see Problem List with Assessments & Recommendations  #3 asthma, suboptimal adherence to anti-inflammatory agents because of concerns about immune suppression. The pathophysiology relating to  this will be discussed. Plan: see Orders

## 2010-11-27 NOTE — Patient Instructions (Signed)
Please  schedule fasting Labs : Lipids,  TSH, PSA.  (V70.0).  There is no increased risk of oral cancer using the inhaled steroids. The only risk is thrush. This can be avoided by aggressive gargling as spitting after use of inhaled steroid.

## 2011-01-15 ENCOUNTER — Ambulatory Visit
Admission: RE | Admit: 2011-01-15 | Discharge: 2011-01-15 | Disposition: A | Discharge status: Home / Self Care | Payer: BC Managed Care – PPO | Source: Ambulatory Visit | Attending: Radiation Oncology | Admitting: Radiation Oncology

## 2011-01-15 LAB — DIFFERENTIAL
Basophils Absolute: 0
Basophils Relative: 0
Eosinophils Absolute: 0.5
Eosinophils Relative: 5
Monocytes Absolute: 0.5
Monocytes Relative: 5
Neutro Abs: 7

## 2011-01-15 LAB — URINALYSIS, ROUTINE W REFLEX MICROSCOPIC
Bilirubin Urine: NEGATIVE
Hgb urine dipstick: NEGATIVE
Ketones, ur: NEGATIVE
Nitrite: NEGATIVE
Protein, ur: NEGATIVE
Specific Gravity, Urine: 1.017
Urobilinogen, UA: 0.2

## 2011-01-15 LAB — BASIC METABOLIC PANEL
CO2: 24
Calcium: 9.9
Chloride: 103
Creatinine, Ser: 0.86
Glucose, Bld: 136 — ABNORMAL HIGH
Sodium: 138

## 2011-01-15 LAB — CBC
Hemoglobin: 14.2
MCHC: 35.3
MCV: 87.9
RDW: 12.9

## 2011-03-11 ENCOUNTER — Encounter: Payer: Self-pay | Admitting: Radiation Oncology

## 2011-03-11 ENCOUNTER — Ambulatory Visit
Admission: RE | Admit: 2011-03-11 | Discharge: 2011-03-11 | Disposition: A | Discharge status: Home / Self Care | Payer: BC Managed Care – PPO | Source: Ambulatory Visit | Attending: Radiation Oncology | Admitting: Radiation Oncology

## 2011-03-11 VITALS — BP 120/72 | HR 65 | Temp 97.6°F | Resp 18 | Wt 161.4 lb

## 2011-03-11 DIAGNOSIS — C099 Malignant neoplasm of tonsil, unspecified: Secondary | ICD-10-CM

## 2011-03-11 NOTE — Progress Notes (Signed)
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 He is without new complaints today. He still has slight discomfort along his left lateral tongue. He missed his last followup visit with Gary Sexton and he will see him again in January of 2013. He has been keeping up with his dentist, Gary Sexton. His taste remains altered. His mouth is slightly dry, particularly in the morning. His weight remained stable.  Physical examination his weight is 161 pounds. Nodes: Without palpable lymphadenopathy in the neck. Oral cavity: There is a whitish 3-4 mm area of inflammation adjacent to his most posterior left lower molar. There is no ulceration or discrete mass. On inspection of the oropharynx there is no visible or palpable evidence for recurrent disease. Indirect mirror examination confirmatory.  Impression: No evidence for recurrent disease. I suspect that his left lateral tongue lesion is from chronic irritation related to his dentition. He'll see Gary Sexton in January for further evaluation.  Plan: He is to see Gary Sexton in January return here for a followup visit in early March.

## 2011-03-11 NOTE — Progress Notes (Signed)
SWALLOWING WITHOUT PROBLEMS EXCEPT IN AM DUE TO THICK SALIVA.   STILL DOESN'T HAVE TASTE BACK.  SKIN LOOKS GOOD

## 2011-03-13 ENCOUNTER — Other Ambulatory Visit: Payer: Self-pay | Admitting: Otolaryngology

## 2011-03-18 ENCOUNTER — Ambulatory Visit: Payer: BC Managed Care – PPO | Admitting: Radiation Oncology

## 2011-05-27 ENCOUNTER — Ambulatory Visit
Admission: RE | Admit: 2011-05-27 | Discharge: 2011-05-27 | Disposition: A | Discharge status: Home / Self Care | Payer: BC Managed Care – PPO | Source: Ambulatory Visit | Attending: Radiation Oncology | Admitting: Radiation Oncology

## 2011-05-27 ENCOUNTER — Encounter: Payer: Self-pay | Admitting: Radiation Oncology

## 2011-05-27 VITALS — BP 132/81 | HR 67 | Temp 97.1°F | Resp 18 | Wt 162.4 lb

## 2011-05-27 DIAGNOSIS — C099 Malignant neoplasm of tonsil, unspecified: Secondary | ICD-10-CM

## 2011-05-27 NOTE — Progress Notes (Signed)
Patient presents to the clinic today for a follow up appointment with Dr. Dayton Scrape. Patient is alert and oriented to person, place, and time. No distress noted. Steady gait noted. Pleasant affect noted. Patient requesting to be seen today by Dr. Dayton Scrape because of pain in his neck. Patient reports intermittent aching pain in his posterior neck 2 on a scale of 0-10. Patient reports that this pain tingles down his left arm if and only if he looks straight up. Patient reports that this pain has been present for 3 weeks now. Patient reports that this pain concerned him because of his past cancer history. Patient reports that the biopsy done by Dr.Byer in 03/2011 was negative. Patient reports his taste is slowly improving and even reports weight gain. Patient reports that he recently saw his dentist and had a cavity. Patient reports he is wearing a temporary crown on a top left tooth. Reported all findings to Dr. Dayton Scrape.
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
## 2011-06-10 ENCOUNTER — Ambulatory Visit: Payer: BC Managed Care – PPO | Admitting: Radiation Oncology

## 2011-08-08 ENCOUNTER — Encounter: Payer: Self-pay | Admitting: Radiation Oncology

## 2011-08-08 DIAGNOSIS — Z923 Personal history of irradiation: Secondary | ICD-10-CM | POA: Insufficient documentation

## 2011-08-08 DIAGNOSIS — C4491 Basal cell carcinoma of skin, unspecified: Secondary | ICD-10-CM | POA: Insufficient documentation

## 2011-08-08 DIAGNOSIS — C801 Malignant (primary) neoplasm, unspecified: Secondary | ICD-10-CM | POA: Insufficient documentation

## 2011-08-13 ENCOUNTER — Encounter: Payer: Self-pay | Admitting: Radiation Oncology

## 2011-08-13 ENCOUNTER — Ambulatory Visit
Admission: RE | Admit: 2011-08-13 | Discharge: 2011-08-13 | Disposition: A | Discharge status: Home / Self Care | Payer: BC Managed Care – PPO | Source: Ambulatory Visit | Attending: Radiation Oncology | Admitting: Radiation Oncology

## 2011-08-13 VITALS — BP 120/76 | HR 66 | Temp 97.6°F | Resp 20 | Wt 167.0 lb

## 2011-08-13 DIAGNOSIS — C099 Malignant neoplasm of tonsil, unspecified: Secondary | ICD-10-CM

## 2011-08-13 NOTE — Progress Notes (Signed)
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 His taste and xerostomia continue to improve. He recently saw Dr. Jearld Fenton on April 1. He'll see him again in June. His tongue lesions were felt to be fungal in respond well to clotrimazole lozenges. 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 He did not see a neurosurgeon or orthopedic surgeon. He is without complaints today. His weight is up approximately 5 pounds over the past 2 months.  Physical examination: Alert and oriented. Vital signs: Wt Readings from Last 3 Encounters:  08/13/11 167 lb (75.751 kg)  05/27/11 162 lb 6.4 oz (73.664 kg)  03/11/11 161 lb 6.4 oz (73.211 kg)   Temp Readings from Last 3 Encounters:  08/13/11 97.6 F (36.4 C) Oral  05/27/11 97.1 F (36.2 C) Oral  03/11/11 97.6 F (36.4 C) Oral   BP Readings from Last 3 Encounters:  08/13/11 120/76  05/27/11 132/81  03/11/11 120/72   Pulse Readings from Last 3 Encounters:  08/13/11 66  05/27/11 67  03/11/11 65   Nodes: Without palpable lymphadenopathy in the neck. Oral cavity and oropharynx are unremarkable to inspection. His mouth is moist. Indirect mirror examination unremarkable and without evidence for recurrent disease. Palpation of the oral cavity and oropharynx likewise unremarkable.  Impression: No evidence for recurrent disease. He will return here in mid August for a followup visit, alternating with Dr. Jearld Fenton.

## 2011-08-13 NOTE — Progress Notes (Signed)
Pt states taste buds returned except sweet. He is tolerating many foods, drinks, does have mild-mod dry mouth. Denies pain, fatigue.

## 2011-09-29 ENCOUNTER — Encounter: Payer: Self-pay | Admitting: Internal Medicine

## 2011-09-29 ENCOUNTER — Ambulatory Visit (INDEPENDENT_AMBULATORY_CARE_PROVIDER_SITE_OTHER): Payer: BC Managed Care – PPO | Admitting: Internal Medicine

## 2011-09-29 VITALS — BP 124/80 | HR 54 | Temp 98.1°F | Wt 163.6 lb

## 2011-09-29 DIAGNOSIS — M67449 Ganglion, unspecified hand: Secondary | ICD-10-CM

## 2011-09-29 DIAGNOSIS — E559 Vitamin D deficiency, unspecified: Secondary | ICD-10-CM

## 2011-09-29 DIAGNOSIS — L723 Sebaceous cyst: Secondary | ICD-10-CM

## 2011-09-29 DIAGNOSIS — M5412 Radiculopathy, cervical region: Secondary | ICD-10-CM

## 2011-09-29 DIAGNOSIS — E785 Hyperlipidemia, unspecified: Secondary | ICD-10-CM

## 2011-09-29 DIAGNOSIS — E039 Hypothyroidism, unspecified: Secondary | ICD-10-CM | POA: Insufficient documentation

## 2011-09-29 NOTE — Progress Notes (Signed)
  Subjective:    Patient ID: Gary Sexton, male    DOB: 1950/07/01, 61 y.o.   MRN: [id number]  HPI  #1 He is here to discuss labs performed 09/24/11 @ his place of employment.  His eosinophil count was 21%; he recently had an asthma flare which is resolving.  LDL was 127,Trigs 92, and HDL 80.  TSH is 94.020 and free T4 0.64.  Vitamin D level is 20.1.  #2 he has had  intermittent left C6 radicular symptoms for 2 months without trigger or injury. This is positional, appearing with neck extension and left rotation    Review of Systems Constitutional: Weight change: no; Fatigue:yes; Sleep pattern:good; Appetite:no taste  Visual change(blurred/diplopia/visual loss):no Hoarseness:no; Swallowing issues:no Cardiovascular: Palpitations:no; Racing:no; Irregularity:no GI: Constipation:no; Diarrhea:no Derm: Change in nails/hair/skin:no Neuro: Numbness/tingling:yes in feet X 10 years; Tremor:no Psych: Anxiety:no; Depression:no; Panic attacks:no Endo: Temperature intolerance: Heat:no; Cold:yes       Objective:   Physical Exam Gen.: Healthy and well-nourished in appearance. Alert, appropriate and cooperative throughout exam. Head: Normocephalic without obvious abnormalities  Eyes: No corneal or conjunctival inflammation noted.  Extraocular motion intact. No lid lag  Neck: No deformities, masses, or tenderness noted. Range of motion normal. Thyroid small. Lungs: Normal respiratory effort; chest expands symmetrically. Lungs are clear to auscultation without rales, wheezes, or increased work of breathing. Heart: Slow rate andregular rhythm. Normal S1 and S2. No gallop, click, or rub. No murmur.                                                                                  Musculoskeletal/extremities: No deformity or scoliosis noted of  the thoracic or lumbar spine; but there is some asymmetry of the posterior thoracic musculature suggesting occult scoliosis. . No clubbing, cyanosis, edema  noted. Range of motion  normal .Tone & strength  normal. Nail health good except for cystic structure at the base of the third right fingernail with nail deformity distally. No onycholysis Vascular: Carotid, radial artery pulses are full and equal. No bruits present. Neurologic: Alert and oriented x3. Deep tendon reflexes symmetrical and normal.No tremor. There is no C.-6 neuromuscular deficit        Skin: Intact without suspicious lesions or rashes. Lymph: No cervical, axillary lymphadenopathy present. Psych: Mood and affect are normal. Normally interactive                                                                                         Assessment & Plan:  #1 see problem list with assessments and recommendations  #2 positional C.-6 radiculopathy. No intervention needed except to avoid the triggering position  #3 digital nail bed cyst, possibly related to foreign body. Hand surgery referral if desired.

## 2011-09-29 NOTE — Assessment & Plan Note (Signed)
Vitamin D 1000 international units will be recommended as 2 pills daily. Vitamin D level should be rechecked in 4 months

## 2011-09-29 NOTE — Patient Instructions (Addendum)
Please try to go on My Chart within the next 24 hours to allow me to release the results directly to you. Please  schedule Labs :  Vit D level &  TSH in 10 weeks .

## 2011-09-29 NOTE — Assessment & Plan Note (Signed)
Levothyroxin 50 mcg was initiated 09/25/11; he is tolerating the dose adequately. TSH should be rechecked after 8-10 weeks

## 2011-09-29 NOTE — Assessment & Plan Note (Signed)
His LDL was 127 and HDL 80 on 0/98/11. He is not on a statin; one should not be initiated until the lipids can be rechecked when his TSH is therapeutic, at least less than 4.5 for at least 6-12 weeks.

## 2011-11-19 ENCOUNTER — Ambulatory Visit: Payer: BC Managed Care – PPO | Attending: Radiation Oncology | Admitting: Radiation Oncology

## 2011-12-26 ENCOUNTER — Other Ambulatory Visit: Payer: Self-pay

## 2011-12-26 DIAGNOSIS — E039 Hypothyroidism, unspecified: Secondary | ICD-10-CM

## 2011-12-26 MED ORDER — LEVOTHYROXINE SODIUM 50 MCG PO TABS: 50.0000 ug | ORAL_TABLET | Freq: Every day | ORAL | Status: DC

## 2011-12-26 NOTE — Telephone Encounter (Signed)
Need Rx okay.    MW

## 2011-12-26 NOTE — Telephone Encounter (Signed)
Pt states he will be out of town all this week and will call us when he returns for a lab visit.

## 2011-12-26 NOTE — Telephone Encounter (Signed)
TSH was 94.020 on 09/24/11. Please refill prescription but schedule TSH ASAP. Code: 244.9

## 2011-12-26 NOTE — Telephone Encounter (Signed)
Spoke with pt and advise Rx sent and to come in for labs.      MW

## 2012-01-13 ENCOUNTER — Encounter: Payer: Self-pay | Admitting: Radiation Oncology

## 2012-01-13 ENCOUNTER — Ambulatory Visit
Admission: RE | Admit: 2012-01-13 | Discharge: 2012-01-13 | Disposition: A | Discharge status: Home / Self Care | Payer: BC Managed Care – PPO | Source: Ambulatory Visit | Attending: Radiation Oncology | Admitting: Radiation Oncology

## 2012-01-13 VITALS — BP 117/71 | HR 73 | Temp 98.4°F | Resp 20 | Wt 168.5 lb

## 2012-01-13 DIAGNOSIS — C099 Malignant neoplasm of tonsil, unspecified: Secondary | ICD-10-CM

## 2012-01-13 NOTE — Progress Notes (Addendum)
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 He saw Dr. 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 He has a history of thrush with a previous benign lateral tongue biopsy. He was supposed to see me for a followup visit in August, so I've not seen him since May, 5 months ago. Since that he seen Dr. Jearld Fenton on 2 occasions is also keeping up with his dentist, Dr. Jonni Sanger. His hearing loss is unchanged. He is now on thyroid replacement therapy through Dr. Alwyn Ren.   Physical examination: Alert and oriented. Wt Readings from Last 3 Encounters:  01/13/12 168 lb 8 oz (76.431 kg)  09/29/11 163 lb 9.6 oz (74.208 kg)  08/13/11 167 lb (75.751 kg)   Temp Readings from Last 3 Encounters:  01/13/12 98.4 F (36.9 C) Oral  09/29/11 98.1 F (36.7 C) Oral  08/13/11 97.6 F (36.4 C) Oral   BP Readings from Last 3 Encounters:  01/13/12 117/71  09/29/11 124/80  08/13/11 120/76   Pulse Readings from Last 3 Encounters:  01/13/12 73  09/29/11 54  08/13/11 66   Nodes: Without palpable lymphadenopathy in neck. 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 This is somewhat thickened. Oropharynx without evidence for recurrent disease along his left tonsil.  Indirect mirror examination confirmatory. On palpation of the oral cavity,  the area of erythroplakia is somewhat indurated.  Impression: No evidence for recurrence of his left tonsillar carcinoma. He'll undergo biopsy of his tongue tomorrow Dr. Jearld Fenton.  Plan: Followup visit with me in 3 months.

## 2012-01-13 NOTE — Addendum Note (Signed)
Encounter addended by: Maryln Gottron, MD on: 01/13/2012 12:34 PM<BR>     Documentation filed: Notes Section

## 2012-01-13 NOTE — Progress Notes (Signed)
Patient here f/u s/p rad tys:12/26/09-01/16/10 squamous cell left tonsil,  Alert,oriented x3, saw Dr. Jearld Fenton  2 weeks ago and was put on med for thrush, had patch on tongue, didn't resolve, saw Dr. Jearld Fenton again yesterday, has ulcer on tongue mid-left side, feels stiff states patient,  will have biopsy tomorrow, no swallowing difficulties stated, no c/o pain, eating well states  Has dry mouth, but it is better ,  11:37 AM

## 2012-01-14 ENCOUNTER — Other Ambulatory Visit: Payer: Self-pay | Admitting: Otolaryngology

## 2012-01-18 IMAGING — CT NM PET TUM IMG RESTAG (PS) SKULL BASE T - THIGH
6 series · 25 of 25 positions shown · IV contrast (350 OM)
Comparison: 11/15/2009 PET CT.  No diagnostic CT at this
institution.

CLINICAL DATA: Subsequent treatment strategy for tonsillar cancer.
Completed radiation therapy January 2010.  Completed chemotherapy
December 2009.

NUCLEAR MEDICINE PET CT RESTAGING (PS) SKULL BASE TO THIGH
TECHNIQUE: 17.3 mCi F-18 FDG was injected intravenously via the
right antecubital IV site.  Full-ring PET imaging was performed
from the skull base through the mid-thighs 62  minutes after
injection.  CT data was obtained and used for attenuation
correction and anatomic localization only.  (This was not acquired
as a diagnostic CT examination.)
Fasting Blood Glucose:  100

[Series 1: pet ac · axial · 3.3mm · 4.69mm/px · z∈[-888,-18]mm · 5 of 267 slices shown]
[im 1/267]
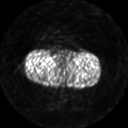
[im 67/267]
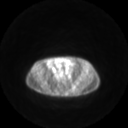
[im 134/267]
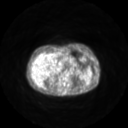
[im 200/267]
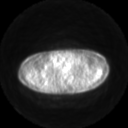
[im 267/267]
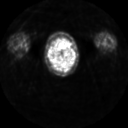

[Series 2: ct images · axial · 3.8mm · 0.98mm/px · z∈[-888,-18]mm · 5 of 267 slices shown]
[im 1/267]
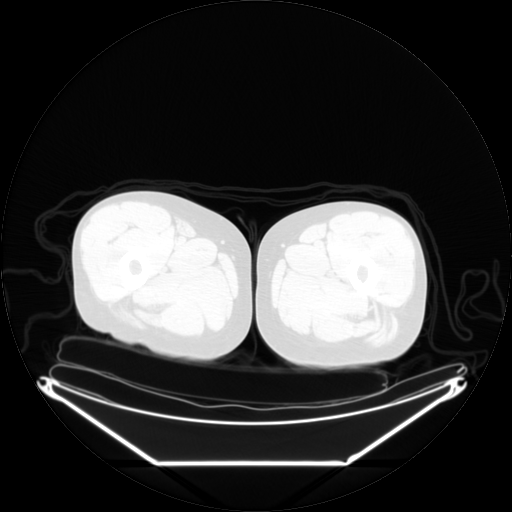
[im 67/267]
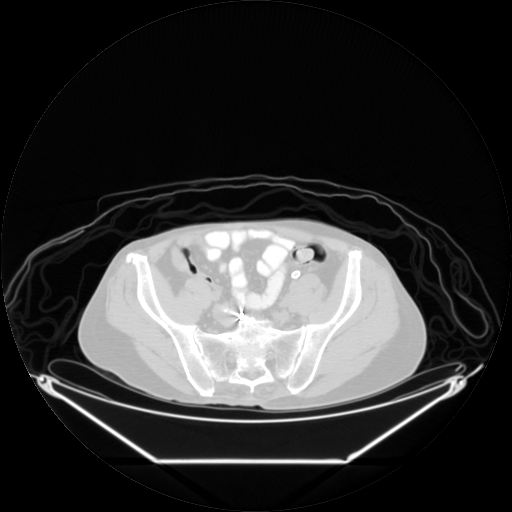
[im 134/267]
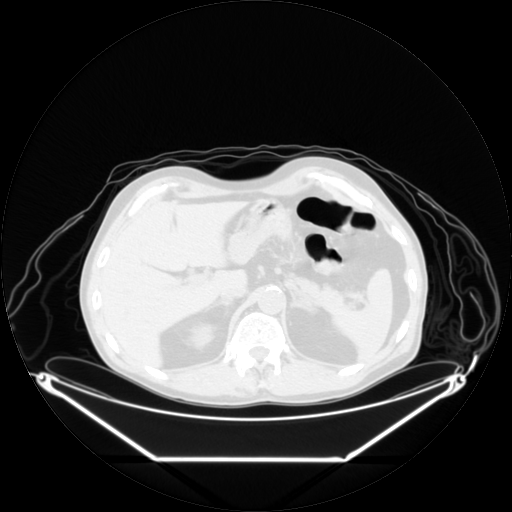
[im 200/267]
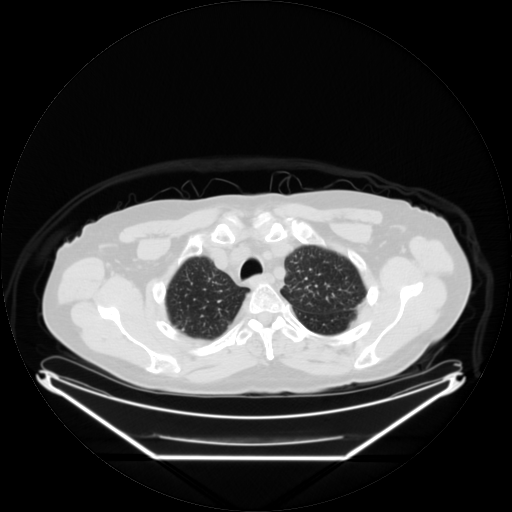
[im 267/267  brain]
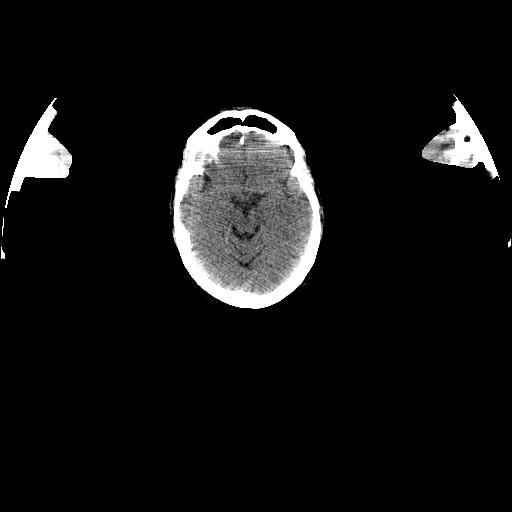

[Series 2: pet nac · axial · 3.3mm · 4.69mm/px · z∈[-888,-18]mm · 6 of 267 slices shown]
[im 1/267]
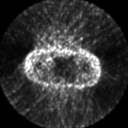
[im 54/267]
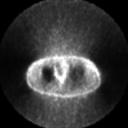
[im 107/267]
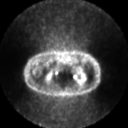
[im 160/267]
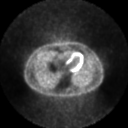
[im 213/267]
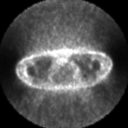
[im 267/267]
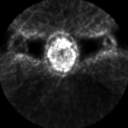

[Series 123: mip · coronal · 3.3mm · 4.69mm/px · 1 of 30 slices shown]
[im 1/30]
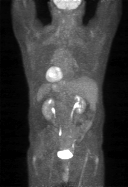

[Series 151: reformatted · axial · 3.3mm · 3.91mm/px · z∈[-888,-18]mm · 6 of 267 slices shown (1 of 2)]
[im 1/267]
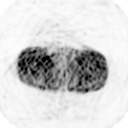
[im 54/267]
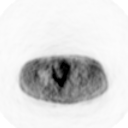
[im 107/267]
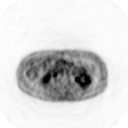
[im 160/267]
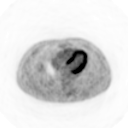
[im 213/267]
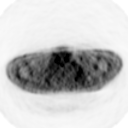
[im 267/267]
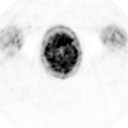

[Series 153: reformatted · coronal · 4.7mm · 6.98mm/px · 2 of 81 slices shown (2 of 2)]
[im 1/81]
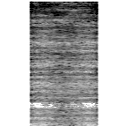
[im 81/81]
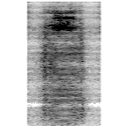

[25 of 25 positions shown; findings below may reference images not displayed]

FINDINGS: Again noted is dense streak artifact from dental amalgam.
This does obscure some detail at the level of the previously seen
FDG avid left tonsillar pillar lesion.  On today's exam, there is
no appreciable abnormal F D G uptake within the region of the left
tonsillar pillar or left jugular node (which is itself now
approximately 5 mm in maximal dimension short axis diameter.

No new abnormal F D G uptake is noted within the neck, chest,
abdomen, or pelvis.  Physiologic F D G uptake noted within
nondilated renal collecting systems.  Retroperitoneal clips are
noted producing streak artifact.

Review of CT images obtained for attenuation correction purposes
demonstrate nonobstructing bilateral renal calculi.
Atherosclerotic vascular calcification noted without aneurysm.
Physiologic uptake noted within the nondilated renal collecting
systems, myocardium, base of brain, and bowel.  Ethmoid and right
maxillary sinusitis noted.  There is minimal soft tissue prominence
of the left tonsillar pillar but no measurable mass allowing for
technique.  Emphysematous changes are again noted within the lungs.
Postsurgical changes noted in the right groin.  Mild right-sided
osteitis condensans ilii incidentally noted.  No new lytic or
sclerotic osseous lesion.
IMPRESSION: Resolution of F D G in avidity within the previously seen left
tonsillar pillar mass-like enlargement and left jugular chain node,
compatible with response to treatment.

No new FDG avid lesion is noted in the neck, chest, abdomen, or
pelvis.

## 2012-01-21 ENCOUNTER — Other Ambulatory Visit: Payer: Self-pay | Admitting: Internal Medicine

## 2012-01-21 NOTE — Telephone Encounter (Signed)
Refill done.  

## 2012-04-13 ENCOUNTER — Encounter: Payer: Self-pay | Admitting: Radiation Oncology

## 2012-04-13 ENCOUNTER — Ambulatory Visit
Admission: RE | Admit: 2012-04-13 | Discharge: 2012-04-13 | Disposition: A | Discharge status: Home / Self Care | Payer: BC Managed Care – PPO | Source: Ambulatory Visit | Attending: Radiation Oncology | Admitting: Radiation Oncology

## 2012-04-13 VITALS — BP 177/98 | HR 80 | Temp 98.4°F | Wt 171.2 lb

## 2012-04-13 DIAGNOSIS — C099 Malignant neoplasm of tonsil, unspecified: Secondary | ICD-10-CM

## 2012-04-13 NOTE — Progress Notes (Signed)
FU appt. Today.  Eating all foods but has burning of mouth if he eats spicy condiments and peppers, but reports that he can't taste sweet very well.  States salivary production is better.

## 2012-04-13 NOTE — Progress Notes (Signed)
CC: Dr. Suzanna Obey, Dr. 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 He tells me that the lesion has since resolved. He sees Dr. Jearld Sexton again in February. He is seeing Dr. Alwyn Sexton tomorrow for evaluation of recent hypertension. He feels that his xerostomia is not as severe. He is on thyroid replacement therapy through Dr. Alwyn Sexton. He maintains dental followup with Dr.Szott and uses fluoride trays. His taste remains altered. Spicy foods are problematic. No change in his hearing. He remains busy at work.  Physical examination: Alert and oriented. Wt Readings from Last 3 Encounters:  04/13/12 171 lb 3.2 oz (77.656 kg)  01/13/12 168 lb 8 oz (76.431 kg)  09/29/11 163 lb 9.6 oz (74.208 kg)   Temp Readings from Last 3 Encounters:  04/13/12 98.4 F (36.9 C)   01/13/12 98.4 F (36.9 C) Oral  09/29/11 98.1 F (36.7 C) Oral   BP Readings from Last 3 Encounters:  04/13/12 177/98  01/13/12 117/71  09/29/11 124/80   Pulse Readings from Last 3 Encounters:  04/13/12 80  01/13/12 73  09/29/11 54   Nodes: There is no palpable lymphadenopathy in the neck. Oral cavity and oropharynx are unremarkable to inspection. There is mild xerostomia. No visible or palpable evidence for recurrent disease along his left tonsil. Indirect mirror examination confirmatory.  Impression: Satisfactory progress. He'll see Dr. Alwyn Sexton tomorrow regarding his hypertension. I told Mr. Gary Sexton that I would feel comfortable with him being seen every 2-3  months by Dr. Jearld Sexton or me. I suggest that I see him back for a followup visit in 4 months and have him see Dr. Jearld Sexton for his next scheduled followup visit in  February. It is encouraging that he is now 2 years out from his treatment he is much less likely to have a recurrence.  Plan: As discussed above

## 2012-04-14 ENCOUNTER — Ambulatory Visit (INDEPENDENT_AMBULATORY_CARE_PROVIDER_SITE_OTHER): Payer: BC Managed Care – PPO | Admitting: Internal Medicine

## 2012-04-14 ENCOUNTER — Telehealth: Payer: Self-pay | Admitting: *Deleted

## 2012-04-14 ENCOUNTER — Encounter: Payer: Self-pay | Admitting: Internal Medicine

## 2012-04-14 VITALS — BP 148/94 | HR 69 | Temp 97.9°F | Wt 171.0 lb

## 2012-04-14 DIAGNOSIS — R030 Elevated blood-pressure reading, without diagnosis of hypertension: Secondary | ICD-10-CM

## 2012-04-14 MED ORDER — LOSARTAN POTASSIUM 100 MG PO TABS: ORAL_TABLET | ORAL | Status: DC

## 2012-04-14 NOTE — Progress Notes (Signed)
  Subjective:    Patient ID: Gary Sexton, male    DOB: [birth year], 62 y.o.   MRN: [id number]  HPI HYPERTENSION: BP @ radiation Onc & ENT has been up to 177/98.Never  on BP meds  Disease Monitoring  Blood pressure range: not monitored @ home  Preventitive Healthcare:  Exercise: 1-2X/week  Diet Pattern: low carb , low sugar  Salt Restriction: no      Review of Systems Chest pain: no   Dyspnea: no  Claudication:no  Lightheadedness: no  Urinary frequency: no   Edema: no      Objective:   Physical Exam He appears healthy and well-nourished; he is in no acute distress  No carotid bruits are present.  Heart rhythm and rate are normal with no significant murmurs or gallops.  Chest is clear with no increased work of breathing  There is no evidence of aortic aneurysm or renal artery bruits  He has no clubbing or edema.   Pedal pulses are intact   No ischemic skin changes are present         Assessment & Plan:  #1 elevated BP w/o HTN dx Plan: See orders and recommendations

## 2012-04-14 NOTE — Telephone Encounter (Signed)
Called patient to inform of fu appt. With Dr. Dayton Scrape on 08-03-12 at 4:00 pm, lvm for a return call

## 2012-04-14 NOTE — Patient Instructions (Addendum)
Your BP goal = AVERAGE < 140/90,ideally <135/85. This AVERAGE should be calculated from @ least 5-7 BP readings taken @ different times of day on different days of week. You should not respond to isolated BP readings , but rather the AVERAGE for that week  Avoid ingestion of  excess salt/sodium.Cook with pepper & other spices . Use the salt substitute "No Salt"(unless your potassium has been elevated) OR the Mrs Sharilyn Sites products to season food @ the table. Avoid foods which taste salty or "vinegary" as their sodium contentet will be high.    If you activate My Chart; the results can be released to you as soon as they populate from the lab. If you choose not to use this program; the labs have to be reviewed, copied & mailed   causing a delay in getting the results to you.

## 2012-08-03 ENCOUNTER — Ambulatory Visit: Payer: Self-pay | Admitting: Radiation Oncology

## 2012-08-03 ENCOUNTER — Ambulatory Visit
Admission: RE | Admit: 2012-08-03 | Discharge: 2012-08-03 | Disposition: A | Discharge status: Home / Self Care | Payer: BC Managed Care – PPO | Source: Ambulatory Visit | Attending: Radiation Oncology | Admitting: Radiation Oncology

## 2012-08-03 VITALS — BP 172/88 | HR 79 | Temp 99.0°F | Wt 173.9 lb

## 2012-08-03 DIAGNOSIS — C099 Malignant neoplasm of tonsil, unspecified: Secondary | ICD-10-CM

## 2012-08-03 NOTE — Progress Notes (Signed)
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 Jearld Fenton next month but last appointment was about 6 weeks ago.

## 2012-08-03 NOTE — Progress Notes (Signed)
CC: Dr. Suzanna Obey, Dr. Marga Melnick, Dr. 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 He still doing well but is having some difficulty getting his blood pressure under control. He is working through Dr. Alwyn Ren. He has not yet picked up his prescription for Cozaar. He describes having hypotensive episodes and describes what sounds like a syncopal episode. He remains on thyroid replacement therapy. He believes that his xerostomia continues to improve. He maintains his dental followup with Dr. Jonni Sanger. He plans on seeing Dr. Jearld Fenton in the near future.  Physical examination: He looks well. Wt Readings from Last 3 Encounters:  08/03/12 173 lb 14.4 oz (78.881 kg)  04/14/12 171 lb (77.565 kg)  04/13/12 171 lb 3.2 oz (77.656 kg)   Temp Readings from Last 3 Encounters:  08/03/12 99 F (37.2 C)   04/14/12 97.9 F (36.6 C) Oral  04/13/12 98.4 F (36.9 C)    BP Readings from Last 3 Encounters:  08/03/12 172/88  04/14/12 148/94  04/13/12 177/98   Pulse Readings from Last 3 Encounters:  08/03/12 79  04/14/12 69  04/13/12 80   Head and neck examination: There is no palpable lymphadenopathy in the neck. Oral cavity and oropharynx remarkable for only minimal xerostomia. There is scarring along his left tonsil without visible evidence for recurrent disease. Indirect mirror examination confirmatory.  Impression: No evidence for recurrent disease. I told Gary Sexton that I think it would be okay for him to see either Dr. Jearld Fenton or me every 2 months, so he'll make an appointment to see Dr. Jearld Fenton in 2 months and see me back in 4 months.  Plan: As discussed above.

## 2012-08-10 ENCOUNTER — Ambulatory Visit: Payer: BC Managed Care – PPO | Admitting: Radiation Oncology

## 2012-11-16 ENCOUNTER — Ambulatory Visit
Admission: RE | Admit: 2012-11-16 | Discharge: 2012-11-16 | Disposition: A | Discharge status: Home / Self Care | Payer: BC Managed Care – PPO | Source: Ambulatory Visit | Attending: Radiation Oncology | Admitting: Radiation Oncology

## 2012-11-16 ENCOUNTER — Encounter: Payer: Self-pay | Admitting: Radiation Oncology

## 2012-11-16 ENCOUNTER — Telehealth: Payer: Self-pay | Admitting: Internal Medicine

## 2012-11-16 VITALS — BP 206/97 | HR 79 | Temp 97.9°F | Resp 16 | Wt 174.7 lb

## 2012-11-16 DIAGNOSIS — C099 Malignant neoplasm of tonsil, unspecified: Secondary | ICD-10-CM

## 2012-11-16 NOTE — Progress Notes (Signed)
New onset hoarseness noted. Denies painful or difficult swallowing. Dry mouth continues to improve. Reports that he only has difficulty breaking down/eating large pieces of bread.  Blood pressure elevated. Encourage patient to contact PCP asap reference elevated bp. Patient verbalized understanding. Reports mild fatigue since onset of hoarseness. Reports TSH was 8 on Wednesday therefore he increased his synthroid to 125 mcg. Last seen by ENT 05/2012. Continues to work full time.

## 2012-11-16 NOTE — Telephone Encounter (Signed)
Last office was 04/14/2012. BP 148/94 at that visit.  Dr Rennie Plowman office has recorded 172/88 on 04/13/2012 and 177/98 on 08/03/2012. 1 year ago he was 120/76 and 124/80. Please Advise.

## 2012-11-16 NOTE — Progress Notes (Signed)
CC:  Dr. Suzanna Obey, Dr. Marga Melnick, Dr. 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 He is generally doing well. He does report development of slight hoarseness approximately 5 days ago. He states that he has less xerostomia. He maintains his dental followup with Dr.Szott and head and neck followup with Dr. Jearld Fenton. He'll see Dr. Jonni Sanger again in October. He was last seen by Dr. Jearld Fenton in February 2014. He reports that his TSH was 8 this past week and his Synthroid was increased to 125 mcg daily. Of note is that his blood pressure today is 206/97. He's not on any antihypertensive medications.  Physical examination: Alert and oriented. He is slightly hoarse.  Wt Readings from Last 3 Encounters:  11/16/12 174 lb 11.2 oz (79.243 kg)  08/03/12 173 lb 14.4 oz (78.881 kg)  04/14/12 171 lb (77.565 kg)   Temp Readings from Last 3 Encounters:  11/16/12 97.9 F (36.6 C) Oral  08/03/12 99 F (37.2 C)   04/14/12 97.9 F (36.6 C) Oral   BP Readings from Last 3 Encounters:  11/16/12 206/97  08/03/12 172/88  04/14/12 148/94   Pulse Readings from Last 3 Encounters:  11/16/12 79  08/03/12 79  04/14/12 69   Nodes: There is no palpable lymphadenopathy in the neck. Oral cavity and oropharynx remarkable for mild xerostomia. No visible or palpable evidence for recurrent disease along his left tonsil. Indirect mirror examination confirmatory. The endolarynx appears to be normal with no inflammation and the vocal cords oppose one other the midline.  Impression: Satisfactory progress with no evidence for recurrent disease. I suspect that he had a recent laryngitis which should improve the near future. If his hoarseness does not improve within 1-2 weeks that he should see Dr. Jearld Fenton.  Plan: He was instructed to contact Dr. Frederik Pear office to  report his blood pressure reading from today. He was instructed to see Dr. Jearld Fenton for a followup visit in 3 months and see me in 6 months.

## 2012-11-16 NOTE — Telephone Encounter (Signed)
5 mg amlodipine  daily starting tonight. #30. Work in appointment 8/20

## 2012-11-16 NOTE — Telephone Encounter (Signed)
Patient just left Dr. Sabino Snipes office in Radiation [facility] and says that his blood pressure while he was there was 206 over 97. Says that he was told by Dr. Dayton Scrape to call our office to see what Dr. Alwyn Ren wanted him to do. Please advise.

## 2012-11-17 ENCOUNTER — Ambulatory Visit (INDEPENDENT_AMBULATORY_CARE_PROVIDER_SITE_OTHER): Payer: BC Managed Care – PPO | Admitting: Internal Medicine

## 2012-11-17 ENCOUNTER — Encounter: Payer: Self-pay | Admitting: Internal Medicine

## 2012-11-17 VITALS — BP 160/110 | HR 75 | Temp 98.0°F | Wt 172.4 lb

## 2012-11-17 DIAGNOSIS — R55 Syncope and collapse: Secondary | ICD-10-CM | POA: Insufficient documentation

## 2012-11-17 DIAGNOSIS — I10 Essential (primary) hypertension: Secondary | ICD-10-CM | POA: Insufficient documentation

## 2012-11-17 DIAGNOSIS — I951 Orthostatic hypotension: Secondary | ICD-10-CM

## 2012-11-17 DIAGNOSIS — R030 Elevated blood-pressure reading, without diagnosis of hypertension: Secondary | ICD-10-CM

## 2012-11-17 LAB — CBC WITH DIFFERENTIAL/PLATELET
Basophils Absolute: 0 10*3/uL (ref 0.0–0.1)
Basophils Relative: 0.6 % (ref 0.0–3.0)
Eosinophils Absolute: 0.6 10*3/uL (ref 0.0–0.7)
Lymphocytes Relative: 12.8 % (ref 12.0–46.0)
MCHC: 34.4 g/dL (ref 30.0–36.0)
MCV: 91.3 fl (ref 78.0–100.0)
Monocytes Absolute: 0.4 10*3/uL (ref 0.1–1.0)
Neutrophils Relative %: 63.7 % (ref 43.0–77.0)
Platelets: 264 10*3/uL (ref 150.0–400.0)
RDW: 12.8 % (ref 11.5–14.6)

## 2012-11-17 LAB — BASIC METABOLIC PANEL
BUN: 13 mg/dL (ref 6–23)
Chloride: 93 mEq/L — ABNORMAL LOW (ref 96–112)
GFR: 102.09 mL/min (ref >60.00)
Potassium: 4.2 mEq/L (ref 3.5–5.1)

## 2012-11-17 MED ORDER — LOSARTAN POTASSIUM 100 MG PO TABS: ORAL_TABLET | ORAL | Status: DC

## 2012-11-17 NOTE — Patient Instructions (Addendum)
Minimal Blood Pressure Goal= AVERAGE < 140/90;  Ideal is an AVERAGE < 135/85. This AVERAGE should be calculated from @ least 5-7 BP readings taken @ different times of day on different days of week. You should not respond to isolated BP readings , but rather the AVERAGE for that week .Please bring your  blood pressure cuff to office visits to verify that it is reliable.It  can also be checked against the blood pressure device at the pharmacy.  Repeat the isometric exercises as discussed 4- 5 times prior to standing if you've been seated or supine for a period of time.   If you activate the  My Chart system; lab & Xray results will be released directly  to you as soon as I review & address these through the computer. If you choose not to sign up for My Chart within 36 hours of labs being drawn; results will be reviewed & interpretation added before being copied & mailed, causing a delay in getting the results to you.If you do not receive that report within 7-10 days ,please call. Additionally you can use this system to gain direct  access to your records  if  out of town or @ an office of a  physician who is not in  the My Chart network.  This improves continuity of care & places you in control of your medical record.

## 2012-11-17 NOTE — Telephone Encounter (Signed)
Called patient. He will come in at 11:30 today. Did not get the message until this morning so his is not on BP med.

## 2012-11-17 NOTE — Progress Notes (Signed)
Subjective:    Patient ID: Gary Sexton, male    DOB: 11/22/1950, 62 y.o.   MRN: [id number]  HPI  His blood pressure was noted to be 206/97 at the Radiation oncology appointment 8/19. He had been prescribed losartan for hypertension; but this was not taken as he felt his average blood pressure was not elevated. The range was 110/70-160/100. His average would be 140-150/90.  Approximately 4-5 months ago he had an episode of syncope. He had come from work and felt lightheaded. He lay down for 15 minutes. Upon arising he lost consciousness for approximately 2 minutes he believes.  There was no associated seizure stigmata. There was no cardiac or neurologic prodrome.  On 3-4 occasions he has noted marked light sensitivity affecting vision acutely.    Review of Systems Specifically he denied any change in heart rhythm or rate prior to the syncopal episode.  He also denied headache, limb weakness, numbness, or tingling other than a chronic tingling in his hands in context of changing head/neck position.  No pain or dyspnea as prodrome to syncope.  Specifically he had no limb jerking or incontinence of urine or stool.  Prior to that episode he had no double vision, blurred vision, or loss of vision. He also denied sudden tinnitus or hearing loss.  He has had laryngitis since last week without associated frontal headache, facial pain, nasal purulence, sore throat, dental pain, otic pain, otic discharge. Secretions are scant and clear. He has run out of his nasal steroid.  He has had thrush of the posterior pharynx when he uses inhaled steroids.       Objective:   Physical Exam Gen.: Healthy and well-nourished in appearance. Alert, appropriate and cooperative throughout exam. Head: Normocephalic without obvious abnormalities Eyes: No corneal or conjunctival inflammation noted.  Extraocular motion intact. Vision grossly normal with lenses Ears: External  ear exam reveals no  significant lesions or deformities. Canals clear .TMs normal. Hearing is grossly decreased bilaterally. Nose: External nasal exam reveals no deformity or inflammation. Nasal mucosa are pink and moist. No lesions or exudates noted.   Mouth: Oral mucosa and oropharynx reveal no lesions or exudates. Teeth in good repair. Very hoarse Neck: No deformities, masses, or tenderness noted. Range of motion & Thyroid normal. Lungs: Normal respiratory effort; chest expands symmetrically. Lungs are clear to auscultation without rales, wheezes, or increased work of breathing. Heart: Normal rate and rhythm. Normal S1 and S2. No gallop, click, or rub. S4 w/o murmur. Abdomen: Bowel sounds normal; abdomen soft and nontender. No masses, organomegaly or hernias noted.Midline op scar                                 Musculoskeletal/extremities: No deformity or scoliosis noted of  the thoracic or lumbar spine.  No clubbing, cyanosis, edema, or significant extremity  deformity noted. Range of motion normal .Tone & strength  Normal. Joints normal. Nail health good. Able to lie down & sit up w/o help. Negative SLR bilaterally Vascular: Carotid, radial artery, dorsalis pedis and  posterior tibial pulses are full and equal. No bruits present. Neurologic: Alert and oriented x3. Deep tendon reflex normal except 0+ R knee.        Skin: Intact without suspicious lesions or rashes. Lymph: No cervical, axillary lymphadenopathy present. Psych: Mood and affect are normal. Normally interactive              Supine blood pressure  on the right was 158/118 and 160/110 on the left. Sitting 150/118 on the right and 160/110 on the left.Standing 158/118 on the right and 140/100 on left                                                                            Assessment & Plan:  #1 HTN #2 syncope; EKG reveals no  AV block. It does reveal incomplete right bundle branch block and right axis versus possible right hypertrophy. No  ischemic changes are noted. #3 hoarseness w/o clinical thrush See Orders

## 2012-11-30 ENCOUNTER — Other Ambulatory Visit: Payer: Self-pay | Admitting: Otolaryngology

## 2012-11-30 DIAGNOSIS — J3800 Paralysis of vocal cords and larynx, unspecified: Secondary | ICD-10-CM

## 2012-12-02 ENCOUNTER — Ambulatory Visit
Admission: RE | Admit: 2012-12-02 | Discharge: 2012-12-02 | Disposition: A | Discharge status: Home / Self Care | Payer: BC Managed Care – PPO | Source: Ambulatory Visit | Attending: Otolaryngology | Admitting: Otolaryngology

## 2012-12-02 DIAGNOSIS — J3800 Paralysis of vocal cords and larynx, unspecified: Secondary | ICD-10-CM

## 2012-12-02 MED ORDER — IOHEXOL 300 MG/ML  SOLN
75.0000 mL | Freq: Once | INTRAMUSCULAR | Status: AC | PRN
  Administered 2012-12-02: 75 mL via INTRAVENOUS

## 2012-12-09 ENCOUNTER — Other Ambulatory Visit (HOSPITAL_COMMUNITY): Payer: Self-pay | Admitting: Otolaryngology

## 2012-12-09 DIAGNOSIS — R918 Other nonspecific abnormal finding of lung field: Secondary | ICD-10-CM

## 2012-12-10 ENCOUNTER — Other Ambulatory Visit (HOSPITAL_COMMUNITY): Payer: Self-pay | Admitting: Otolaryngology

## 2012-12-10 DIAGNOSIS — R918 Other nonspecific abnormal finding of lung field: Secondary | ICD-10-CM

## 2012-12-13 ENCOUNTER — Encounter: Payer: Self-pay | Admitting: Radiation Oncology

## 2012-12-13 NOTE — Progress Notes (Signed)
CC: Dr. Ofilia Neas, Dr. Suzanna Obey, Dr. Si Gaul, Dr. Marga Melnick    Chart note:  Gary Sexton continued to have hoarseness, and he was seen by Dr. 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 A CT of the neck was obtained which showed what was felt to be a "primary lung cancer" along the left upper mediastinum. He was tentatively scheduled for a CT guided biopsy while I was out of town last week. Radiology wanted to have a diagnostic CT scan of the chest before considering a biopsy. 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 There was no lower neck adenopathy seen on PET or on examination. He has been a nonsmoker. I reviewed his case with Dr. Si Gaul, and also with Dr. Ofilia Neas this afternoon. Although it is uncommon to see recurrent HPV positive disease 3 years following completion of therapy, his primary tumor was left-sided, and his IMRT/Tomotherapy radiation therapy field extended inferiorly to the superior aspect of his current lung/mediastainal mass. I explained to Mr. Johnsey that he should keep his appointment for his contrast chest CT scan tomorrow, and that Dr. Tyrone Sage would determine the best approach for diagnosing his suspected malignancy. Dr. Tyrone Sage is kind enough to see him this Wednesday or Thursday. Pathology will be obtained for P16 along with non-small cell lung markers. Dr. Tyrone Sage will also complete his staging workup with a PET scan. If this is felt to be recurrent head and neck cancer then he would be considered for chemoradiation recognizing that he is at significant risk for distant metastases. If this is felt to represent a primary lung cancer, then he could be considered for surgery with either preoperative or postoperative radiation therapy.

## 2012-12-14 ENCOUNTER — Institutional Professional Consult (permissible substitution) (INDEPENDENT_AMBULATORY_CARE_PROVIDER_SITE_OTHER): Payer: BC Managed Care – PPO | Admitting: Cardiothoracic Surgery

## 2012-12-14 ENCOUNTER — Other Ambulatory Visit: Payer: Self-pay

## 2012-12-14 ENCOUNTER — Encounter (HOSPITAL_COMMUNITY): Payer: Self-pay

## 2012-12-14 ENCOUNTER — Other Ambulatory Visit: Payer: Self-pay | Admitting: *Deleted

## 2012-12-14 ENCOUNTER — Ambulatory Visit (HOSPITAL_COMMUNITY)
Admission: RE | Admit: 2012-12-14 | Discharge: 2012-12-14 | Disposition: A | Discharge status: Home / Self Care | Payer: BC Managed Care – PPO | Source: Ambulatory Visit | Attending: Otolaryngology | Admitting: Otolaryngology

## 2012-12-14 ENCOUNTER — Encounter: Payer: Self-pay | Admitting: Cardiothoracic Surgery

## 2012-12-14 VITALS — BP 197/113 | HR 84 | Resp 16 | Ht 69.0 in | Wt 172.0 lb

## 2012-12-14 DIAGNOSIS — N200 Calculus of kidney: Secondary | ICD-10-CM | POA: Insufficient documentation

## 2012-12-14 DIAGNOSIS — R918 Other nonspecific abnormal finding of lung field: Secondary | ICD-10-CM | POA: Insufficient documentation

## 2012-12-14 DIAGNOSIS — R222 Localized swelling, mass and lump, trunk: Secondary | ICD-10-CM | POA: Insufficient documentation

## 2012-12-14 DIAGNOSIS — R911 Solitary pulmonary nodule: Secondary | ICD-10-CM

## 2012-12-14 DIAGNOSIS — R590 Localized enlarged lymph nodes: Secondary | ICD-10-CM

## 2012-12-14 DIAGNOSIS — D381 Neoplasm of uncertain behavior of trachea, bronchus and lung: Secondary | ICD-10-CM

## 2012-12-14 DIAGNOSIS — R599 Enlarged lymph nodes, unspecified: Secondary | ICD-10-CM

## 2012-12-14 DIAGNOSIS — M47814 Spondylosis without myelopathy or radiculopathy, thoracic region: Secondary | ICD-10-CM | POA: Insufficient documentation

## 2012-12-14 MED ORDER — IOHEXOL 300 MG/ML  SOLN
75.0000 mL | Freq: Once | INTRAMUSCULAR | Status: AC | PRN
  Administered 2012-12-14: 75 mL via INTRAVENOUS

## 2012-12-14 NOTE — Progress Notes (Signed)
[address]       [place] [address]             [phone]                    Gary Sexton [facility] Medical Record #[id number] Date of Birth: 04-30-1950  Referring: Maryln Gottron, MD Primary Care: Marga Melnick, MD  Chief Complaint:    Chief Complaint  Patient presents with  . Lung Lesion    Surgical eval on LLLobe nodule and lung mass, Chest CT 12/14/2012, Pet Scan scheduled for 12/21/12  . Lung Mass    left upper mediastinal  . 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 He was treated with chemotherapy including cisplatin and radiation at that time. Several weeks ago he woke up with sudden hoarseness. Laryngoscopy by Dr. Jearld Fenton revealed a left vocal cord paralysis. CT scan of the neck questioned a left lung mass. The patient comes to the thoracic surgical office with a followup CT scan of the chest for further evaluation. He denies any hemoptysis, has had recent new onset of hypertension. He notes that his previous chemotherapy left him with paresthesias in his fingertips and significant loss of hearing.  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     Current Activity/ Functional Status:  Patient is independent with mobility/ambulation, transfers, ADL's, IADL's.  Zubrod Score: At the time of surgery this patient's most appropriate activity status/level should be described as: []  Normal activity, no symptoms [x]  Symptoms, fully ambulatory []  Symptoms, in bed less than or equal to 50% of the time []  Symptoms, in bed greater than 50% of the time but less than 100% []  Bedridden []  Moribund   Past Medical History  Diagnosis Date  . Cancer 11/06/2009    SquamousCell of Tonsil   . Asthma   . renal calculi 2004    Dr  Retta Diones  . Hemangioma of liver   . History of radiation therapy 12/18/09 to 02/04/10    left tonsil/neck  . History of radiation therapy 1973    "hockey stick", [facility], [place], [place]  . Testicular cancer 1973  . Achilles tendon rupture   . Diverticulitis   . Colon polyps   . Kidney stones   . Basal cell carcinoma     field of radiation-abdomen    Past Surgical History  Procedure Laterality Date  . Orchiectomy  1973    LN resection  & radiation  . Hernia repair      bilateral  . Colonoscopy with polypectomy  2003    neg 2008    Family History  Problem Relation Age of Onset  . Stroke Father 49  . Hypertension Father     medical discharge from [employer]  . Stroke Maternal Uncle      ? 60  . Cancer Maternal Grandfather     lung  . Kidney failure Maternal Grandmother     > 400 #  . Cancer Paternal Grandmother     ?  Marland Kitchen Aneurysm Brother     thoracic  . Alzheimer's disease Mother   . Cancer Mother     breast    History   Social History  .  Marital Status: Married    Spouse Name: N/A    Number of Children: N/A  . Years of Education: N/A   Occupational History  . Not on file.   Social History Main Topics  . Smoking status: Never Smoker   . Smokeless tobacco: Never Used  . Alcohol Use: Yes     Comment:  1-3 beers/ day  . Drug Use: No  . Sexual Activity: Not on file   Other Topics Concern  .  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   Social History Narrative   Married, 2 children, works in Mining engineer    History  Smoking status  . Never Smoker   Smokeless tobacco  . Never Used    History  Alcohol Use  . Yes    Comment:  1-3 beers/ day     No Known Allergies  Current Outpatient Prescriptions  Medication Sig Dispense Refill  . aspirin 325 MG tablet Take 325 mg by mouth daily.      . beclomethasone  (BECONASE-AQ) 42 MCG/SPRAY nasal spray Place 2 sprays into the nose 2 (two) times daily as needed. Dose is for each nostril.      . Fluticasone-Salmeterol (ADVAIR DISKUS) 100-50 MCG/DOSE AEPB       . levothyroxine (SYNTHROID, LEVOTHROID) 125 MCG tablet Take 125 mcg by mouth daily before breakfast.      . losartan (COZAAR) 100 MG tablet 1/2-1 to keep BP < 140/90  30 tablet  5  . Multiple Vitamin (MULTIVITAMIN) tablet Take 1 tablet by mouth daily.      . tadalafil (CIALIS) 20 MG tablet Take 1 tablet (20 mg total) by mouth as directed.  6 tablet  3   No current facility-administered medications for this visit.       Review of Systems:     Cardiac Review of Systems: Y or N  Chest Pain [  n  ]  Resting SOB [  n ] Exertional SOB  [ n]  Orthopnea [n  ]   Pedal Edema [ n  ]    Palpitations [n  ] Syncope  [ n ]   Presyncope [n  ]  General Review of Systems: [Y] = yes [  ]=no Constitional: recent weight change Cove.Etienne  ]; anorexia [  ]; fatigue Cove.Etienne  ]; nausea [  ]; night sweats [ n ]; fever [n  ]; or chills [  n];                                                                                                                                          Dental: poor dentition[  ]; Last Dentist visit:   Eye : blurred vision [  ]; diplopia [   ]; vision changes [  n  ];  Amaurosis fugax[  ]; Resp: cough [ y ];  wheezing[  ];  hemoptysis[  ]; shortness of breath[  ]; paroxysmal nocturnal dyspnea[n  ]; dyspnea on exertion[  ]; or orthopnea[  ];  GI:  gallstones[  ], vomiting[ n ];  dysphagia[n  ]; melena[ n ];  hematochezia [n  ]; heartburn[  ];   Hx of  Colonoscopy[  ]; GU: kidney stones [  ]; hematuria[n  ];   dysuria [n  ];  nocturia[  ];  history of     obstruction [n  ]; urinary frequency [  ]             Skin: rash, swelling[  ];, hair loss[  ];  peripheral edema[  ];  or itching[  ]; Musculosketetal: myalgias[  ];  joint swelling[  ];  joint erythema[  ];  joint pain[  ];  back pain[  ];  Heme/Lymph:  bruising[  ];  bleeding[  ];  anemia[  ];  Neuro: TIA[  ];  headaches[  ];  stroke[n  ];  vertigo[n  ];  seizures[ n ];   paresthesias[ y ];  difficulty walking[  ];  Psych:depression[  ]; anxiety[  ];  Endocrine: diabetes[  ];  thyroid dysfunction[  ];  Immunizations: Flu [  ]; Pneumococcal[  ];  Other:  Physical Exam: BP 197/113  Pulse 84  Resp 16  Ht 5\' 9"  (1.753 m)  Wt 172 lb (78.019 kg)  BMI 25.39 kg/m2  SpO2 98%  General appearance: alert and cooperative Neurologic: intact Heart: regular rate and rhythm, S1, S2 normal, no murmur, click, rub or gallop Lungs: clear to auscultation bilaterally and normal percussion bilaterally Abdomen: soft, non-tender; bowel sounds normal; no masses,  no organomegaly Extremities: extremities normal, atraumatic, no cyanosis or edema and Homans sign is negative, no sign of DVT Patient has no cervical or supraclavicular adenopathy, he has well-healed lower abdominal incision and a healed site where previous gastrostomy tube had been placed   Diagnostic Studies & Laboratory data:     Recent Radiology Findings:   Ct Chest W Contrast  12/14/2012   *RADIOLOGY REPORT*  Clinical Data:   Evaluate lung mass  CT CHEST WITH CONTRAST  Technique:  Multidetector CT imaging of the chest was performed following the standard protocol during bolus administration of intravenous contrast.  Contrast: 75mL OMNIPAQUE IOHEXOL 300 MG/ML  SOLN  Comparison: PET CT 05/06/2010  Findings: There is no pleural effusion identified.  Partially cavitary nodule in the medial left lower lobe measures 3.1 cm, image 33/series 3.  Within the left upper lobe there is a paramediastinal, subpleural mass measuring 4.4 cm, image 16/series 3.  Small satellite nodule within the left upper lobe peripherally measures 6 mm, image 21/series 3.  Biapical scarring is identified. Enlarged left hilar lymph nodes are identified.  Index node measures 2.1 cm, image 21/series 2.  Lymph node adjacent to the  left main pulmonary artery measures 1.6 cm, image 24/series 2.  No right paratracheal, subcarinal or right hilar adenopathy.  Limited imaging through the upper abdomen shows a 6 mm low attenuation structure in the right hepatic lobe.  This is too small to characterize.  Small stones are noted within the upper pole the left kidney.  Review of the visualized osseous structures is significant for mild thoracic spondylosis.  No aggressive lytic or sclerotic bone lesions identified.  IMPRESSION:  1. Cavitary nodule in the left lower lobe is identified  and is worrisome for tumor.  The cavitary nature of this nodule suggest squamous cell carcinoma which may be primary or metastatic from the patient's head neck primary. 2.  Left upper lobe paramediastinal subpleural mass and left hilar adenopathy is also worrisome for malignancy.  Advise further evaluation with PET CT and tissue sampling. 3.  Left renal calculi.   Original Report Authenticated By: Signa Kell, M.D.      Recent Lab Findings: Lab Results  Component Value Date   WBC 4.8 11/17/2012   HGB 13.9 11/17/2012   HCT 40.4 11/17/2012   PLT 264.0 11/17/2012   GLUCOSE 92 11/17/2012   CHOL 191 10/18/2009   TRIG 101.0 10/18/2009   HDL 59.90 10/18/2009   LDLCALC 111* 10/18/2009   ALT 16 05/08/2010   AST 19 05/08/2010   NA 132* 11/17/2012   K 4.2 11/17/2012   CL 93* 11/17/2012   CREATININE 0.8 11/17/2012   BUN 13 11/17/2012   CO2 32 11/17/2012   TSH 2.794 05/08/2010      Assessment / Plan:     History of T2 N1 squamous cell carcinoma of left tonsil New left vocal cord paralysis with left hilar mass, and lung mass superior segment left lower lobe suspicious for malignancy primary lung versus metastatic Significant new onset of hypertension  I have are viewed with the patient the CT findings, I recommended that we proceed with obtaining a tissue diagnosis, PET scan and MRI of the brain. The case has been discussed with Dr. Fredia Sorrow, concerning Needle biopsy of  the left hilar mass. This will be arranged for the near future. Plan to see the patient back soon after the biopsy to discuss further treatment options and tissue diagnosis. The patient had requested that I call his brother Dr. Donavan Foil, chief of surgery at [facility] in [place] [phone]- [phone]. I attempted to contact him but so far have been unable to.  Delight Ovens MD      [address] [place] [place] [address] Office [phone]   Beeper [phone]  12/14/2012 5:02 PM

## 2012-12-17 ENCOUNTER — Ambulatory Visit
Admission: RE | Admit: 2012-12-17 | Discharge: 2012-12-17 | Disposition: A | Discharge status: Home / Self Care | Payer: BC Managed Care – PPO | Source: Ambulatory Visit | Attending: Cardiothoracic Surgery | Admitting: Cardiothoracic Surgery

## 2012-12-17 DIAGNOSIS — R918 Other nonspecific abnormal finding of lung field: Secondary | ICD-10-CM

## 2012-12-17 MED ORDER — GADOBENATE DIMEGLUMINE 529 MG/ML IV SOLN
16.0000 mL | Freq: Once | INTRAVENOUS | Status: AC | PRN
  Administered 2012-12-17: 16 mL via INTRAVENOUS

## 2012-12-20 ENCOUNTER — Encounter (HOSPITAL_COMMUNITY): Payer: Self-pay | Admitting: Pharmacy Technician

## 2012-12-20 ENCOUNTER — Telehealth: Payer: Self-pay | Admitting: Radiation Oncology

## 2012-12-20 NOTE — Telephone Encounter (Signed)
Patient requesting to have records transferred. Patient spoke with Adline Mango at the front desk reference records. Also, patient requesting that Dr. Dayton Scrape contact him at [phone].

## 2012-12-21 ENCOUNTER — Encounter (HOSPITAL_COMMUNITY): Payer: Self-pay

## 2012-12-21 ENCOUNTER — Encounter (HOSPITAL_COMMUNITY)
Admission: RE | Admit: 2012-12-21 | Discharge: 2012-12-21 | Disposition: A | Discharge status: Home / Self Care | Payer: BC Managed Care – PPO | Source: Ambulatory Visit | Attending: Cardiothoracic Surgery | Admitting: Cardiothoracic Surgery

## 2012-12-21 ENCOUNTER — Other Ambulatory Visit: Payer: Self-pay | Admitting: Radiology

## 2012-12-21 DIAGNOSIS — R222 Localized swelling, mass and lump, trunk: Secondary | ICD-10-CM | POA: Insufficient documentation

## 2012-12-21 DIAGNOSIS — Z85819 Personal history of malignant neoplasm of unspecified site of lip, oral cavity, and pharynx: Secondary | ICD-10-CM | POA: Insufficient documentation

## 2012-12-21 DIAGNOSIS — D381 Neoplasm of uncertain behavior of trachea, bronchus and lung: Secondary | ICD-10-CM

## 2012-12-21 DIAGNOSIS — R918 Other nonspecific abnormal finding of lung field: Secondary | ICD-10-CM | POA: Insufficient documentation

## 2012-12-21 DIAGNOSIS — Z9079 Acquired absence of other genital organ(s): Secondary | ICD-10-CM | POA: Insufficient documentation

## 2012-12-21 DIAGNOSIS — J329 Chronic sinusitis, unspecified: Secondary | ICD-10-CM | POA: Insufficient documentation

## 2012-12-21 DIAGNOSIS — N200 Calculus of kidney: Secondary | ICD-10-CM | POA: Insufficient documentation

## 2012-12-21 LAB — GLUCOSE, CAPILLARY: Glucose-Capillary: 103 mg/dL — ABNORMAL HIGH (ref 70–99)

## 2012-12-21 MED ORDER — FLUDEOXYGLUCOSE F - 18 (FDG) INJECTION
19.0000 | Freq: Once | INTRAVENOUS | Status: AC | PRN
  Administered 2012-12-21: 19 via INTRAVENOUS

## 2012-12-22 ENCOUNTER — Ambulatory Visit (HOSPITAL_COMMUNITY)
Admission: RE | Admit: 2012-12-22 | Discharge: 2012-12-22 | Disposition: A | Discharge status: Home / Self Care | Payer: BC Managed Care – PPO | Source: Ambulatory Visit | Attending: Interventional Radiology | Admitting: Interventional Radiology

## 2012-12-22 ENCOUNTER — Encounter (HOSPITAL_COMMUNITY): Payer: Self-pay

## 2012-12-22 ENCOUNTER — Telehealth: Payer: Self-pay | Admitting: Radiation Oncology

## 2012-12-22 ENCOUNTER — Ambulatory Visit (HOSPITAL_COMMUNITY)
Admission: RE | Admit: 2012-12-22 | Discharge: 2012-12-22 | Disposition: A | Discharge status: Home / Self Care | Payer: BC Managed Care – PPO | Source: Ambulatory Visit | Attending: Otolaryngology | Admitting: Otolaryngology

## 2012-12-22 DIAGNOSIS — C3490 Malignant neoplasm of unspecified part of unspecified bronchus or lung: Secondary | ICD-10-CM | POA: Insufficient documentation

## 2012-12-22 DIAGNOSIS — R948 Abnormal results of function studies of other organs and systems: Secondary | ICD-10-CM | POA: Insufficient documentation

## 2012-12-22 DIAGNOSIS — R918 Other nonspecific abnormal finding of lung field: Secondary | ICD-10-CM | POA: Insufficient documentation

## 2012-12-22 LAB — CBC
MCH: 30.7 pg (ref 26.0–34.0)
MCV: 88.8 fL (ref 78.0–100.0)
Platelets: 212 10*3/uL (ref 150–400)
RDW: 12.5 % (ref 11.5–15.5)
WBC: 4.4 10*3/uL (ref 4.0–10.5)

## 2012-12-22 MED ORDER — HYDROCODONE-ACETAMINOPHEN 5-325 MG PO TABS: 1.0000 | ORAL_TABLET | ORAL | Status: DC | PRN

## 2012-12-22 MED ORDER — FENTANYL CITRATE 0.05 MG/ML IJ SOLN
INTRAMUSCULAR | Status: AC | PRN
  Administered 2012-12-22 (×2): 50 ug via INTRAVENOUS

## 2012-12-22 MED ORDER — MIDAZOLAM HCL 2 MG/2ML IJ SOLN
INTRAMUSCULAR | Status: AC | PRN
  Administered 2012-12-22: 1 mg via INTRAVENOUS

## 2012-12-22 MED ORDER — FENTANYL CITRATE 0.05 MG/ML IJ SOLN
INTRAMUSCULAR | Status: AC
  Filled 2012-12-22: qty 4

## 2012-12-22 MED ORDER — SODIUM CHLORIDE 0.9 % IV SOLN
INTRAVENOUS | Status: DC
  Administered 2012-12-22: 20 mL/h via INTRAVENOUS

## 2012-12-22 MED ORDER — MIDAZOLAM HCL 2 MG/2ML IJ SOLN
INTRAMUSCULAR | Status: AC
  Filled 2012-12-22: qty 4

## 2012-12-22 NOTE — Procedures (Signed)
CT core biopsy LUL lesion No complication No blood loss. See complete dictation in [facility].

## 2012-12-22 NOTE — H&P (Signed)
Chief Complaint: "I'm here for a lung biopsy" Referring Physician:Byers HPI: Gary Sexton is an 62 y.o. male with findings of left lung masses. He has had formal workup including PET and is now scheduled for CT guided biopsy. PMHx and meds reviewed. He's feeling well otherwise.  Past Medical History:  Past Medical History  Diagnosis Date  . Cancer 11/06/2009    SquamousCell of Tonsil   . Asthma   . renal calculi 2004    Dr Retta Diones  . Hemangioma of liver   . History of radiation therapy 12/18/09 to 02/04/10    left tonsil/neck  . History of radiation therapy 1973    "hockey stick", [facility], [place], [place]  . Testicular cancer 1973  . Achilles tendon rupture   . Diverticulitis   . Colon polyps   . Kidney stones   . Basal cell carcinoma     field of radiation-abdomen    Past Surgical History:  Past Surgical History  Procedure Laterality Date  . Orchiectomy  1973    LN resection  & radiation  . Hernia repair      bilateral  . Colonoscopy with polypectomy  2003    neg 2008    Family History:  Family History  Problem Relation Age of Onset  . Stroke Father 28  . Hypertension Father     medical discharge from [employer]  . Stroke Maternal Uncle      ? 60  . Cancer Maternal Grandfather     lung  . Kidney failure Maternal Grandmother     > 400 #  . Cancer Paternal Grandmother     ?  Marland Kitchen Aneurysm Brother     thoracic  . Alzheimer's disease Mother   . Cancer Mother     breast    Social History:  reports that he has never smoked. He has never used smokeless tobacco. He reports that  drinks alcohol. He reports that he does not use illicit drugs.  Allergies: No Known Allergies  Medications:   Medication List    ASK your doctor about these medications       ADVAIR DISKUS 100-50 MCG/DOSE Aepb  Generic drug:  Fluticasone-Salmeterol  Take 1 puff by mouth daily as needed (for shortness of breath).     aspirin 325 MG tablet  Take 325 mg by mouth daily.      fluticasone 50 MCG/ACT nasal spray  Commonly known as:  FLONASE  Place 2 sprays into the nose daily as needed for rhinitis or allergies.     levothyroxine 125 MCG tablet  Commonly known as:  SYNTHROID, LEVOTHROID  Take 62.5 mcg by mouth 2 (two) times daily.     losartan 100 MG tablet  Commonly known as:  COZAAR  Take 50-100 mg by mouth daily. To keep BP <140/90     multivitamin tablet  Take 1 tablet by mouth daily.     tadalafil 20 MG tablet  Commonly known as:  CIALIS  Take 1 tablet (20 mg total) by mouth as directed.        Please HPI for pertinent positives, otherwise complete 10 system ROS negative.  Physical Exam: BP 176/90  Pulse 73  Temp(Src) 98.2 F (36.8 C) (Oral)  Resp 18  Ht 5\' 9"  (1.753 m)  Wt 172 lb (78.019 kg)  BMI 25.39 kg/m2  SpO2 95% Body mass index is 25.39 kg/(m^2).   General Appearance:  Alert, cooperative, no distress, appears stated age  Head:  Normocephalic, without obvious  abnormality, atraumatic  ENT: Unremarkable  Neck: Supple, symmetrical, trachea midline  Lungs:   Clear to auscultation bilaterally, no w/r/r, respirations unlabored without use of accessory muscles.  Chest Wall:  No tenderness or deformity  Heart:  Regular rate and rhythm, S1, S2 normal, no murmur, rub or gallop.  Neurologic: Normal affect, no gross deficits.   Results for orders placed during the hospital encounter of 12/22/12 (from the past 48 hour(s))  APTT     Status: None   Collection Time    12/22/12  7:56 AM      Result Value Range   aPTT 29  24 - 37 seconds  CBC     Status: Abnormal   Collection Time    12/22/12  7:56 AM      Result Value Range   WBC 4.4  4.0 - 10.5 K/uL   RBC 4.30  4.22 - 5.81 MIL/uL   Hemoglobin 13.2  13.0 - 17.0 g/dL   HCT 78.4 (*) 69.6 - 29.5 %   MCV 88.8  78.0 - 100.0 fL   MCH 30.7  26.0 - 34.0 pg   MCHC 34.6  30.0 - 36.0 g/dL   RDW 28.4  13.2 - 44.0 %   Platelets 212  150 - 400 K/uL  PROTIME-INR     Status: None   Collection Time     12/22/12  7:56 AM      Result Value Range   Prothrombin Time 12.3  11.6 - 15.2 seconds   INR 0.93  0.00 - 1.49   Nm Pet Image Restag (ps) Skull Base To Thigh  12/21/2012   CLINICAL DATA:  Subsequent treatment strategy for pulmonary nodules. History of tonsillar cancer with lung mass. Restaging.  EXAM: NUCLEAR MEDICINE PET SKULL BASE TO THIGH  FASTING BLOOD GLUCOSE:  Value:  103 mg/dl  TECHNIQUE: 10.2 mCi V-25 FDG was injected intravenously. CT data was obtained and used for attenuation correction and anatomic localization only. (This was not acquired as a diagnostic CT examination.) Additional exam technical data entered on technologist worksheet. Dedicated supplementary views were obtained of the neck.  COMPARISON:  05/06/2010. Chest CT 12/14/2012.  FINDINGS: NECK  No areas of abnormal hypermetabolism.  CHEST  Hypermetabolic central left upper lobe lung mass with direct extension versus contiguous adenopathy in to the lateral aspect of the AP window. This measures 2.7 x 4.2 cm and a S.U.V. max of 20.8 on image 80/series 2. Intimately associated with of the transverse aorta, without well-defined fat plane between.  Increasingly cavitary left lower lobe hypermetabolic lung nodule. This measures 2.8 x 2.8 cm and a S.U.V. max of 17.5 on image 95/series 2.  No contralateral mediastinal or hilar nodal hypermetabolism.  ABDOMEN/PELVIS  No areas of abnormal hypermetabolism.  SKELETON  No abnormal marrow activity.  CT IMAGES PERFORMED FOR ATTENUATION CORRECTION  Neck and chest findings deferred to recent diagnostic CTs. Mucosal thickening in the ethmoid air cells and sphenoid sinuses. No other pulmonary nodules identified.  Bilateral nephrolithiasis. Normal adrenal glands. Mild bladder wall irregularity, possibly related to a component of outlet obstruction. Right orchiectomy. Likely degenerative sclerosis of the right sacroiliac joint.  IMPRESSION: 1. Left upper and left lower lobe pulmonary lesions, highly  suspicious for synchronous primary bronchogenic carcinomas. Although the cavitary appearance of the left lower lobe nodule suggests squamous cell histology, squamous primary is favored over metastasis from prior head neck primary, given absence of other pulmonary nodules. 2. The left upper lobe nodule is intimately associated with the  transverse aorta and demonstrates adjacent adenopathy versus direct extension into the lateral aspect of the AP window. Otherwise, no evidence of thoracic nodal or extra-thoracic hypermetabolic metastasis. 3. Incidental findings, including nephrolithiasis and chronic sinusitis.   Electronically Signed   By: Jeronimo Greaves   On: 12/21/2012 17:07    Assessment/Plan Left lung masses Discussed CT guided perc biopsy. Explained risks and complications, including PTX, possible chest tube/admission, bleeding/hemoptysis. Explained use of sedation. Labs reviewed. Consent signed in chart  Brayton El PA-C 12/22/2012, 8:23 AM

## 2012-12-22 NOTE — Telephone Encounter (Signed)
Faxed NPE 11/28/09, EOT 02/04/10, FUP 11/16/12, path 11/06/09, PET 12/21/12 to Siobhan at [facility], [phone].  OK per RJM.  Received confirmation.

## 2012-12-23 ENCOUNTER — Telehealth: Payer: Self-pay | Admitting: Cardiothoracic Surgery

## 2012-12-23 ENCOUNTER — Telehealth (HOSPITAL_COMMUNITY): Payer: Self-pay | Admitting: *Deleted

## 2012-12-23 NOTE — Telephone Encounter (Signed)
Patient had PET scan completed 2 days ago and needle biopsy of left lung mass yesterday. Results today indicate squamous cell carcinoma of the lung, final immunohistochemical stains still pending. The patient had called and requested the results. I have called him and discussed with him the findings of squamous cell carcinoma in the left lung mass. He is considering a second opinion at [facility], but would like referral to medical oncology at [place] which I will arrange.

## 2012-12-24 ENCOUNTER — Encounter: Payer: Self-pay | Admitting: Radiation Oncology

## 2012-12-24 NOTE — Progress Notes (Signed)
Chart Note: I spoke with Dr. Adolphus Birchwood this morning and biopsy from upper left chest is P16 + with a similar histologic appearance as his tonsillar primary. Dr. Adolphus Birchwood could not tell me the percentage of squamous cell lung cancers that are P16 +. Mr. Corliss Skains will see Dr. Arbutus Ped this coming week. He told me by phone last night that he will probably seek second opinions at [facility]. 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

## 2012-12-27 ENCOUNTER — Encounter: Payer: Self-pay | Admitting: Internal Medicine

## 2012-12-27 ENCOUNTER — Telehealth: Payer: Self-pay | Admitting: Hematology and Oncology

## 2012-12-27 NOTE — Telephone Encounter (Signed)
Pt called to schedule np appt 10/02 @ 8 w/Dr. Bertis Ruddy

## 2012-12-27 NOTE — Telephone Encounter (Signed)
Lvom for pt to return call in re to referral.

## 2012-12-28 ENCOUNTER — Telehealth: Payer: Self-pay | Admitting: Hematology and Oncology

## 2012-12-28 NOTE — Telephone Encounter (Signed)
C/D 12/28/12 for appt. 12/30/12 °

## 2012-12-30 ENCOUNTER — Other Ambulatory Visit: Payer: BC Managed Care – PPO

## 2012-12-30 ENCOUNTER — Ambulatory Visit (HOSPITAL_BASED_OUTPATIENT_CLINIC_OR_DEPARTMENT_OTHER): Payer: BC Managed Care – PPO | Admitting: Hematology and Oncology

## 2012-12-30 ENCOUNTER — Telehealth: Payer: Self-pay | Admitting: Radiation Oncology

## 2012-12-30 ENCOUNTER — Encounter: Payer: Self-pay | Admitting: *Deleted

## 2012-12-30 ENCOUNTER — Ambulatory Visit: Payer: BC Managed Care – PPO

## 2012-12-30 ENCOUNTER — Ambulatory Visit (HOSPITAL_BASED_OUTPATIENT_CLINIC_OR_DEPARTMENT_OTHER): Payer: BC Managed Care – PPO | Admitting: Lab

## 2012-12-30 ENCOUNTER — Encounter: Payer: Self-pay | Admitting: Hematology and Oncology

## 2012-12-30 ENCOUNTER — Telehealth: Payer: Self-pay | Admitting: Hematology and Oncology

## 2012-12-30 ENCOUNTER — Other Ambulatory Visit: Payer: Self-pay | Admitting: *Deleted

## 2012-12-30 VITALS — BP 119/95 | HR 77 | Temp 97.1°F | Resp 19 | Ht 69.0 in | Wt 168.1 lb

## 2012-12-30 DIAGNOSIS — C099 Malignant neoplasm of tonsil, unspecified: Secondary | ICD-10-CM

## 2012-12-30 DIAGNOSIS — C7802 Secondary malignant neoplasm of left lung: Secondary | ICD-10-CM

## 2012-12-30 DIAGNOSIS — C7800 Secondary malignant neoplasm of unspecified lung: Secondary | ICD-10-CM

## 2012-12-30 DIAGNOSIS — R490 Dysphonia: Secondary | ICD-10-CM

## 2012-12-30 DIAGNOSIS — D649 Anemia, unspecified: Secondary | ICD-10-CM

## 2012-12-30 HISTORY — DX: Secondary malignant neoplasm of unspecified lung: C78.00

## 2012-12-30 LAB — CBC WITH DIFFERENTIAL/PLATELET
BASO%: 1.1 % (ref 0.0–2.0)
EOS%: 7.8 % — ABNORMAL HIGH (ref 0.0–7.0)
Eosinophils Absolute: 0.4 10*3/uL (ref 0.0–0.5)
HCT: 36.7 % — ABNORMAL LOW (ref 38.4–49.9)
MCHC: 35.1 g/dL (ref 32.0–36.0)
MONO#: 0.4 10*3/uL (ref 0.1–0.9)
NEUT#: 3.4 10*3/uL (ref 1.5–6.5)
RBC: 4.11 10*6/uL — ABNORMAL LOW (ref 4.20–5.82)
WBC: 4.6 10*3/uL (ref 4.0–10.3)
lymph#: 0.3 10*3/uL — ABNORMAL LOW (ref 0.9–3.3)

## 2012-12-30 LAB — COMPREHENSIVE METABOLIC PANEL (CC13)
ALT: 18 U/L (ref 0–55)
AST: 22 U/L (ref 5–34)
Albumin: 3.9 g/dL (ref 3.5–5.0)
BUN: 14.8 mg/dL (ref 7.0–26.0)
CO2: 26 mEq/L (ref 22–29)
Glucose: 100 mg/dl (ref 70–140)
Sodium: 133 mEq/L — ABNORMAL LOW (ref 136–145)
Total Bilirubin: 0.45 mg/dL (ref 0.20–1.20)
Total Protein: 7.5 g/dL (ref 6.4–8.3)

## 2012-12-30 LAB — MAGNESIUM (CC13): Magnesium: 2.2 mg/dl (ref 1.5–2.5)

## 2012-12-30 NOTE — Telephone Encounter (Signed)
Gave pt appt for MD  2 weeks from now and chemo class todat, pt sent to labs today

## 2012-12-30 NOTE — Telephone Encounter (Signed)
Faxed NPE 11/28/09, EOT 02/04/10, FUP 11/16/12, path 11/06/09, PET 12/21/12 to [facility] [phone].  OK per RJM.  Received confirmation.

## 2012-12-30 NOTE — Progress Notes (Signed)
Met with pt and his family during consult with Dr. Bertis Ruddy.  Introduced myself as Statistician and encouraged them to call at any time with questions/concerns.  Will navigate as L1 (new) patient.  Young Berry, RN, BSN, [facility] Head & Neck Oncology Navigator [phone]

## 2012-12-30 NOTE — Progress Notes (Signed)
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 He had right orchiectomy followed by adjuvant radiation therapy. That was in 1973. 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 Staging was T2, N1, M0 and he was treated with concurrent chemoradiation therapy with high-dose cisplatin. According to the patient, due to significant side effects from treatment, he did not receive the third cycle of chemotherapy. Side effects include or cold toxicity, tinnitus, malnutrition with 50 pound weight loss and mild peripheral neuropathy After treatment, he managed to gain back approximately 18 pounds and subsequently has his feeding tube removed. He was followup periodically with imaging studies as well as laryngoscope be to rule out disease recurrence.  INTERVAL HISTORY: HUMBERTO ADDO 62 y.o. male returns for further management related to his history of recurrence of his cancer. According to the patient, he developed laryngitis and hoarseness of voice approximately 6 weeks ago. He denies any swallowing difficulties. No recent weight loss. He has mild nonproductive cough occasionally spit some thick saliva which he attributed to prior radiation treatment. On 12/21/2012 he had a PET/CT scan which show hypermetabolic activity in the left lung as well as lymphadenopathy in the mediastinum abutting into adjacent large vessels. On 12/22/2012, he had CT-guided biopsy which confirmed squamous cell carcinoma, HPV positive, identical to his prior diagnosis of squamous cell carcinoma of the tonsil. He is being referred here for further evaluation. The patient did have discussion at [facility] yesterday who recommended palliative  chemotherapy with combination Taxotere and carboplatin.   I have reviewed the past medical history, past surgical history, social history and family history with the patient and they are unchanged from previous note.  ALLERGIES:  has No Known Allergies.  MEDICATIONS: Current outpatient prescriptions:aspirin 325 MG tablet, Take 325 mg by mouth daily., Disp: , Rfl: ;  levothyroxine (SYNTHROID, LEVOTHROID) 125 MCG tablet, Take 62.5 mcg by mouth 2 (two) times daily. , Disp: , Rfl: ;  losartan (COZAAR) 100 MG tablet, Take 50-100 mg by mouth daily. To keep BP <140/90, Disp: , Rfl: ;  Multiple Vitamin (MULTIVITAMIN) tablet, Take 1 tablet by mouth daily., Disp: , Rfl:  fluticasone (FLONASE) 50 MCG/ACT nasal spray, Place 2 sprays into the nose daily as needed for rhinitis or allergies., Disp: , Rfl: ;  Fluticasone-Salmeterol (ADVAIR DISKUS) 100-50 MCG/DOSE AEPB, Take 1 puff by mouth daily as needed (for shortness of breath). , Disp: , Rfl: ;  tadalafil (CIALIS) 20 MG tablet, Take 1 tablet (20 mg total) by mouth as directed., Disp: 6 tablet, Rfl: 3  REVIEW OF SYSTEMS:   Constitutional: Denies fevers, chills or abnormal weight loss Eyes: Denies blurriness of vision Respiratory: Denies cough, dyspnea or wheezes Cardiovascular: Denies palpitation, chest discomfort or lower extremity swelling Gastrointestinal:  Denies nausea, heartburn or change in bowel habits Skin: Denies abnormal skin rashes Lymphatics: Denies new lymphadenopathy or easy bruising Neurological:Denies numbness, tingling or new weaknesses Behavioral/Psych: Mood is stable, no new changes  All other systems were reviewed with the patient and are negative.  PHYSICAL EXAMINATION: ECOG PERFORMANCE STATUS: 0 - Asymptomatic  Filed Vitals:   12/30/12 0819  BP: 119/95  Pulse: 77  Temp: 97.1 F (36.2 C)  Resp: 19   Filed Weights   12/30/12 0819  Weight: 168 lb  1.6 oz (76.25 kg)    GENERAL:alert, no distress and comfortable SKIN: skin  color, texture, turgor are normal, no rashes or significant lesions EYES: normal, Conjunctiva are pink and non-injected, sclera clear OROPHARYNX:no exudate, no erythema and lips, buccal mucosa, and tongue normal  NECK: supple, thyroid normal size, non-tender, without nodularity LYMPH:  no palpable lymphadenopathy in the cervical, axillary or inguinal LUNGS: clear to auscultation and percussion with normal breathing effort HEART: regular rate & rhythm and no murmurs and no lower extremity edema ABDOMEN:abdomen soft, non-tender and normal bowel sounds Musculoskeletal:no cyanosis of digits and no clubbing  NEURO: alert & oriented x 3 with fluent speech, no focal motor/sensory deficits  LABORATORY DATA:  I have reviewed the data as listed    Component Value Date/Time   NA 133* 12/30/2012 1021   NA 132* 11/17/2012 1316   K 4.8 12/30/2012 1021   K 4.2 11/17/2012 1316   CL 93* 11/17/2012 1316   CO2 26 12/30/2012 1021   CO2 32 11/17/2012 1316   GLUCOSE 100 12/30/2012 1021   GLUCOSE 92 11/17/2012 1316   BUN 14.8 12/30/2012 1021   BUN 13 11/17/2012 1316   CREATININE 0.8 12/30/2012 1021   CREATININE 0.8 11/17/2012 1316   CALCIUM 10.4 12/30/2012 1021   CALCIUM 10.0 11/17/2012 1316   PROT 7.5 12/30/2012 1021   PROT 6.5 05/08/2010 1148   ALBUMIN 3.9 12/30/2012 1021   ALBUMIN 4.4 05/08/2010 1148   AST 22 12/30/2012 1021   AST 19 05/08/2010 1148   ALT 18 12/30/2012 1021   ALT 16 05/08/2010 1148   ALKPHOS 62 12/30/2012 1021   ALKPHOS 38* 05/08/2010 1148   BILITOT 0.45 12/30/2012 1021   BILITOT 0.4 05/08/2010 1148   GFRNONAA 101.67 10/18/2009 1054   GFRAA 128 08/24/2007 1109    No results found for this basename: SPEP, UPEP,  kappa and lambda light chains    Lab Results  Component Value Date   WBC 4.6 12/30/2012   NEUTROABS 3.4 12/30/2012   HGB 12.9* 12/30/2012   HCT 36.7* 12/30/2012   MCV 89.2 12/30/2012   PLT 240 12/30/2012      Chemistry      Component Value Date/Time   NA 133* 12/30/2012 1021   NA 132*  11/17/2012 1316   K 4.8 12/30/2012 1021   K 4.2 11/17/2012 1316   CL 93* 11/17/2012 1316   CO2 26 12/30/2012 1021   CO2 32 11/17/2012 1316   BUN 14.8 12/30/2012 1021   BUN 13 11/17/2012 1316   CREATININE 0.8 12/30/2012 1021   CREATININE 0.8 11/17/2012 1316      Component Value Date/Time   CALCIUM 10.4 12/30/2012 1021   CALCIUM 10.0 11/17/2012 1316   ALKPHOS 62 12/30/2012 1021   ALKPHOS 38* 05/08/2010 1148   AST 22 12/30/2012 1021   AST 19 05/08/2010 1148   ALT 18 12/30/2012 1021   ALT 16 05/08/2010 1148   BILITOT 0.45 12/30/2012 1021   BILITOT 0.4 05/08/2010 1148     A review of the most recent imaging study with him and his family ASSESSMENT: 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 We discussed, due to stage IV disease, the goal of chemotherapy would be palliative only. The patient wanted to be  treated aggressively. We discussed the risk combination chemotherapy and the patient wanted triple combination chemotherapy which I think is not unreasonable given he has excellent performance status with minimal comorbidities. I recommend placement of Infuse-a-Port prior to treatment. We will also need to baseline blood work today. His family members are wondering about referral to a tertiary center or enrollment in clinical trials. Unfortunately we do not have any clinical trials available. We discussed specifically options such as combination chemotherapy with carboplatin, 5-FU with cetuximab versus weekly carboplatin, paclitaxel with cetuximab or carboplatin and paclitaxel as suggested by [employer]. Some of the side effects we discussed, including the risk of mucositis, nausea, diarrhea, peripheral neuropathy, rash, risk of infection and  death and he is in agreement to proceed. After 6 cycles of treatment, if he still has residual disease, which could potentially recommend palliative radiation therapy to residual disease. His family is wondering about risk of allergic reactions to chemotherapy. Chances of him having severe, life-threatening anaphylactic reaction to chemotherapy is estimated in the region of 5%. I tried to reassure him that we have a protocol in place to be given as premedications prior to chemotherapy to minimize the risk of allergic reaction. Ultimately, after 1-1/2 hour of discussion, the patient is undecided about what he wants to be treated and the combination chemotherapy. I recommended he return next week for further discussion of the patient is encouraged to call my office once he makes up his mind where he wants to be treated and the combination of chemotherapy. All questions were answered. The patient knows to call the clinic with any problems, questions or concerns. We can certainly see the patient much sooner if necessary. No barriers to learning was detected. I spent counseling the patient face to face. The total time spent in the appointment was 100 minutes and more than 50% was on counseling and review of test results     [facility], Hiawatha Merriott, MD 12/30/2012 11:04 PM

## 2012-12-31 ENCOUNTER — Other Ambulatory Visit: Payer: Self-pay | Admitting: Radiology

## 2012-12-31 ENCOUNTER — Encounter (HOSPITAL_COMMUNITY): Payer: Self-pay | Admitting: Pharmacy Technician

## 2012-12-31 ENCOUNTER — Other Ambulatory Visit: Payer: Self-pay | Admitting: Hematology and Oncology

## 2013-01-02 ENCOUNTER — Encounter: Payer: Self-pay | Admitting: Hematology and Oncology

## 2013-01-03 ENCOUNTER — Ambulatory Visit (HOSPITAL_COMMUNITY)
Admission: RE | Admit: 2013-01-03 | Discharge: 2013-01-03 | Disposition: A | Discharge status: Home / Self Care | Payer: BC Managed Care – PPO | Source: Ambulatory Visit | Attending: Hematology and Oncology | Admitting: Hematology and Oncology

## 2013-01-03 ENCOUNTER — Other Ambulatory Visit: Payer: Self-pay | Admitting: Hematology and Oncology

## 2013-01-03 ENCOUNTER — Encounter (HOSPITAL_COMMUNITY): Payer: Self-pay

## 2013-01-03 ENCOUNTER — Encounter: Payer: Self-pay | Admitting: Hematology and Oncology

## 2013-01-03 ENCOUNTER — Telehealth: Payer: Self-pay | Admitting: *Deleted

## 2013-01-03 DIAGNOSIS — J45909 Unspecified asthma, uncomplicated: Secondary | ICD-10-CM | POA: Insufficient documentation

## 2013-01-03 DIAGNOSIS — Z7982 Long term (current) use of aspirin: Secondary | ICD-10-CM | POA: Insufficient documentation

## 2013-01-03 DIAGNOSIS — C099 Malignant neoplasm of tonsil, unspecified: Secondary | ICD-10-CM | POA: Insufficient documentation

## 2013-01-03 DIAGNOSIS — C7800 Secondary malignant neoplasm of unspecified lung: Secondary | ICD-10-CM | POA: Insufficient documentation

## 2013-01-03 LAB — CBC WITH DIFFERENTIAL/PLATELET
Basophils Absolute: 0 10*3/uL (ref 0.0–0.1)
Basophils Relative: 0 % (ref 0–1)
HCT: 39.5 % (ref 39.0–52.0)
Hemoglobin: 13.8 g/dL (ref 13.0–17.0)
Lymphocytes Relative: 8 % — ABNORMAL LOW (ref 12–46)
Lymphs Abs: 0.5 10*3/uL — ABNORMAL LOW (ref 0.7–4.0)
MCV: 88.2 fL (ref 78.0–100.0)
Monocytes Absolute: 0.5 10*3/uL (ref 0.1–1.0)
Monocytes Relative: 9 % (ref 3–12)
Neutro Abs: 4.1 10*3/uL (ref 1.7–7.7)
Neutrophils Relative %: 73 % (ref 43–77)
RDW: 12.2 % (ref 11.5–15.5)
WBC: 5.5 10*3/uL (ref 4.0–10.5)

## 2013-01-03 MED ORDER — FENTANYL CITRATE 0.05 MG/ML IJ SOLN
INTRAMUSCULAR | Status: AC | PRN
  Administered 2013-01-03: 100 ug via INTRAVENOUS
  Administered 2013-01-03: 25 ug via INTRAVENOUS

## 2013-01-03 MED ORDER — FENTANYL CITRATE 0.05 MG/ML IJ SOLN
INTRAMUSCULAR | Status: AC
  Filled 2013-01-03: qty 6

## 2013-01-03 MED ORDER — HEPARIN SOD (PORK) LOCK FLUSH 100 UNIT/ML IV SOLN
INTRAVENOUS | Status: AC | PRN
  Administered 2013-01-03: 500 [IU]

## 2013-01-03 MED ORDER — CEFAZOLIN SODIUM-DEXTROSE 2-3 GM-% IV SOLR
2.0000 g | Freq: Once | INTRAVENOUS | Status: AC
  Administered 2013-01-03: 2 g via INTRAVENOUS

## 2013-01-03 MED ORDER — MIDAZOLAM HCL 2 MG/2ML IJ SOLN
INTRAMUSCULAR | Status: AC
  Filled 2013-01-03: qty 6

## 2013-01-03 MED ORDER — LIDOCAINE-EPINEPHRINE (PF) 2 %-1:200000 IJ SOLN
INTRAMUSCULAR | Status: AC
  Filled 2013-01-03: qty 20

## 2013-01-03 MED ORDER — LIDOCAINE HCL 1 % IJ SOLN
INTRAMUSCULAR | Status: AC
  Filled 2013-01-03: qty 20

## 2013-01-03 MED ORDER — SODIUM CHLORIDE 0.9 % IV SOLN
INTRAVENOUS | Status: DC
  Administered 2013-01-03: 12:00:00 via INTRAVENOUS

## 2013-01-03 MED ORDER — MIDAZOLAM HCL 2 MG/2ML IJ SOLN
INTRAMUSCULAR | Status: AC | PRN
  Administered 2013-01-03: 0.5 mg via INTRAVENOUS
  Administered 2013-01-03: 1 mg via INTRAVENOUS

## 2013-01-03 MED ORDER — CEFAZOLIN SODIUM-DEXTROSE 2-3 GM-% IV SOLR
INTRAVENOUS | Status: AC
  Administered 2013-01-03: 2 g via INTRAVENOUS
  Filled 2013-01-03: qty 50

## 2013-01-03 NOTE — Telephone Encounter (Signed)
Per staff message and POF I have scheduled appts.  JMW  

## 2013-01-03 NOTE — Progress Notes (Signed)
Put fmla form on nurse's desk °

## 2013-01-03 NOTE — Procedures (Signed)
Portacath placement, tip in lower SVC.  Port is ready to use.  No immediate complication.

## 2013-01-03 NOTE — H&P (Signed)
Chief Complaint: "I'm here for a port" Referring Physician:Byers HPI: Gary Sexton is an 62 y.o. male with findings of left lung masses that were biopsied and are positive for metastatic cancer. He is to start chemotherapy and is now referred for Portacath. PMHx and meds reviewed. He's feeling well otherwise. He tolerated his recent lung biopsy with sedation very well.  Past Medical History:  Past Medical History  Diagnosis Date  . Cancer 11/06/2009    SquamousCell of Tonsil   . Asthma   . renal calculi 2004    Dr Retta Diones  . Hemangioma of liver   . History of radiation therapy 12/18/09 to 02/04/10    left tonsil/neck  . History of radiation therapy 1973    "hockey stick", [facility] [place], [place]  . Testicular cancer 1973  . Achilles tendon rupture   . Diverticulitis   . Colon polyps   . Kidney stones   . Basal cell carcinoma     field of radiation-abdomen  . Lung metastases 12/30/2012    Past Surgical History:  Past Surgical History  Procedure Laterality Date  . Orchiectomy  1973    LN resection  & radiation  . Hernia repair      bilateral  . Colonoscopy with polypectomy  2003    neg 2008    Family History:  Family History  Problem Relation Age of Onset  . Stroke Father 51  . Hypertension Father     medical discharge from [employer]  . Stroke Maternal Uncle      ? 60  . Cancer Maternal Grandfather     lung  . Kidney failure Maternal Grandmother     > 400 #  . Cancer Paternal Grandmother     ?  Marland Kitchen Aneurysm Brother     thoracic  . Alzheimer's disease Mother   . Cancer Mother     breast    Social History:  reports that he has never smoked. He has never used smokeless tobacco. He reports that he does not drink alcohol or use illicit drugs.  Allergies: No Known Allergies  Medications:   Medication List    ASK your doctor about these medications       aspirin 325 MG tablet  Take 325 mg by mouth daily.     levothyroxine 125 MCG tablet  Commonly  known as:  SYNTHROID, LEVOTHROID  Take 62.5 mcg by mouth 2 (two) times daily.     losartan 100 MG tablet  Commonly known as:  COZAAR  Take 50-100 mg by mouth every morning. To keep BP <140/90     multivitamin tablet  Take 1 tablet by mouth daily.     tadalafil 20 MG tablet  Commonly known as:  CIALIS  Take 1 tablet (20 mg total) by mouth as directed.     TURMERIC PO  Take by mouth.     vitamin C 1000 MG tablet  Take 1,000 mg by mouth daily.        Please HPI for pertinent positives, otherwise complete 10 system ROS negative.  Physical Exam: BP 145/86  Pulse 70  Temp(Src) 98.1 F (36.7 C) (Oral)  Resp 18  SpO2 100% There is no weight on file to calculate BMI.   General Appearance:  Alert, cooperative, no distress, appears stated age  Head:  Normocephalic, without obvious abnormality, atraumatic  ENT: Unremarkable  Neck: Supple, symmetrical, trachea midline  Lungs:   Clear to auscultation bilaterally, no w/r/r, respirations unlabored without use of  accessory muscles.  Chest Wall:  No tenderness or deformity  Heart:  Regular rate and rhythm, S1, S2 normal, no murmur, rub or gallop.  Neurologic: Normal affect, no gross deficits.   Results for orders placed during the hospital encounter of 01/03/13 (from the past 48 hour(s))  CBC WITH DIFFERENTIAL     Status: Abnormal   Collection Time    01/03/13 11:50 AM      Result Value Range   WBC 5.5  4.0 - 10.5 K/uL   RBC 4.48  4.22 - 5.81 MIL/uL   Hemoglobin 13.8  13.0 - 17.0 g/dL   HCT 47.8  29.5 - 62.1 %   MCV 88.2  78.0 - 100.0 fL   MCH 30.8  26.0 - 34.0 pg   MCHC 34.9  30.0 - 36.0 g/dL   RDW 30.8  65.7 - 84.6 %   Platelets 241  150 - 400 K/uL   Neutrophils Relative % 73  43 - 77 %   Neutro Abs 4.1  1.7 - 7.7 K/uL   Lymphocytes Relative 8 (*) 12 - 46 %   Lymphs Abs 0.5 (*) 0.7 - 4.0 K/uL   Monocytes Relative 9  3 - 12 %   Monocytes Absolute 0.5  0.1 - 1.0 K/uL   Eosinophils Relative 9 (*) 0 - 5 %   Eosinophils  Absolute 0.5  0.0 - 0.7 K/uL   Basophils Relative 0  0 - 1 %   Basophils Absolute 0.0  0.0 - 0.1 K/uL  PROTIME-INR     Status: None   Collection Time    01/03/13 11:50 AM      Result Value Range   Prothrombin Time 12.8  11.6 - 15.2 seconds   INR 0.98  0.00 - 1.49   No results found.  Assessment/Plan Metastatic squamous cell cancer to the lungs. Discussed portacath placement Explained risks and complications, use of sedation. Labs reviewed. Consent signed in chart  Brayton El PA-C 01/03/2013, 12:58 PM

## 2013-01-05 ENCOUNTER — Other Ambulatory Visit: Payer: Self-pay | Admitting: *Deleted

## 2013-01-05 ENCOUNTER — Encounter: Payer: Self-pay | Admitting: Hematology and Oncology

## 2013-01-05 MED ORDER — LIDOCAINE-PRILOCAINE 2.5-2.5 % EX CREA: TOPICAL_CREAM | CUTANEOUS | Status: AC | PRN

## 2013-01-05 NOTE — Progress Notes (Signed)
Faxed wife's fmla form to [employer] @ [phone] °

## 2013-01-06 ENCOUNTER — Telehealth: Payer: Self-pay | Admitting: *Deleted

## 2013-01-06 ENCOUNTER — Other Ambulatory Visit: Payer: Self-pay | Admitting: Hematology and Oncology

## 2013-01-06 ENCOUNTER — Telehealth: Payer: Self-pay | Admitting: Radiation Oncology

## 2013-01-06 NOTE — Telephone Encounter (Signed)
Printed tx plan from [facility], gave to patient.  Per pt, he said he had spoken with Dr. Dayton Scrape yesterday, and he said to give him whatever he needed.  Dr. Dayton Scrape is not in today or tomorrow.

## 2013-01-07 ENCOUNTER — Telehealth: Payer: Self-pay | Admitting: Hematology and Oncology

## 2013-01-07 NOTE — Telephone Encounter (Signed)
Called pt re appt for 10/15 and per pt he cannot come he will be @ [facility] and asked that appts here be cx'd. Per pt he will call when he returns. Message to NG.

## 2013-01-11 ENCOUNTER — Telehealth: Payer: Self-pay | Admitting: *Deleted

## 2013-01-11 NOTE — Telephone Encounter (Signed)
Gary Sexton called and indicated that he has an appt @ [facility] next Monday 10/13, returning to [facility] on Wednesday, 10/15. He requested that his appt with Dr. Bertis Ruddy on 10/15 and his first infusion on 10/16 be cancelled. He indicated further that he spoke again with Dr. Lissa Morales at [facility] [phone]) who has a different take on a chemo regime. He said that Dr. Bertis Ruddy can call him if she wishes.  Relayed information to Dr. Bertis Ruddy via [place].    Young Berry, RN, BSN, [facility] Head & Neck Oncology Navigator [phone]

## 2013-01-11 NOTE — Telephone Encounter (Signed)
Error in documentation

## 2013-01-12 ENCOUNTER — Ambulatory Visit: Payer: BC Managed Care – PPO | Admitting: Hematology and Oncology

## 2013-01-13 ENCOUNTER — Encounter: Payer: Self-pay | Admitting: *Deleted

## 2013-01-13 ENCOUNTER — Ambulatory Visit: Payer: BC Managed Care – PPO

## 2013-01-13 NOTE — Progress Notes (Signed)
[facility] Psychosocial Distress Screening Clinical Social Work  Clinical Social Work was referred by distress screening protocol.  The patient scored a 5 on the Psychosocial Distress Thermometer which indicates moderate distress. Clinical Social Worker left vm to assess for distress and other psychosocial needs.    Clinical Social Worker follow up needed: no   Doreen Salvage, LCSW Clinical Social Worker [facility] for Patient & Family Support [facility] Wednesday, Thursday and Friday Phone: [phone] Fax: [phone]

## 2013-01-19 ENCOUNTER — Ambulatory Visit: Payer: BC Managed Care – PPO | Admitting: Hematology and Oncology

## 2013-01-20 ENCOUNTER — Ambulatory Visit: Payer: BC Managed Care – PPO

## 2013-01-27 ENCOUNTER — Ambulatory Visit: Payer: BC Managed Care – PPO

## 2013-02-03 ENCOUNTER — Other Ambulatory Visit: Payer: Self-pay

## 2013-03-30 ENCOUNTER — Other Ambulatory Visit: Payer: Self-pay | Admitting: Hematology and Oncology

## 2013-04-22 ENCOUNTER — Telehealth: Payer: Self-pay | Admitting: Radiation Oncology

## 2013-04-22 NOTE — Telephone Encounter (Signed)
[facility] stated they never received tx plan.  Fed exed - [phone].

## 2013-05-16 ENCOUNTER — Telehealth: Payer: Self-pay | Admitting: *Deleted

## 2013-05-16 NOTE — Telephone Encounter (Signed)
Patient called statsing cancel his 05/18/13 appt follow up with Dr.Murray, he is in [place] at [facility], not be back until April Will let Dr.Murray know, 1:13 PM

## 2013-05-18 ENCOUNTER — Ambulatory Visit: Payer: BC Managed Care – PPO | Admitting: Radiation Oncology

## 2013-10-29 DEATH — deceased

## 2013-12-31 ENCOUNTER — Encounter: Payer: Self-pay | Admitting: Gastroenterology

## 2014-01-13 ENCOUNTER — Other Ambulatory Visit: Payer: Self-pay

## 2014-02-08 ENCOUNTER — Other Ambulatory Visit: Payer: Self-pay | Admitting: Hematology and Oncology

## 2014-09-05 IMAGING — CR DG CHEST 1V
1 series · 1 of 1 positions shown · non-contrast
Comparison: CT chest today

CLINICAL DATA: Post lung biopsy

EXAM:
CHEST - 1 VIEW

[w chest pa]
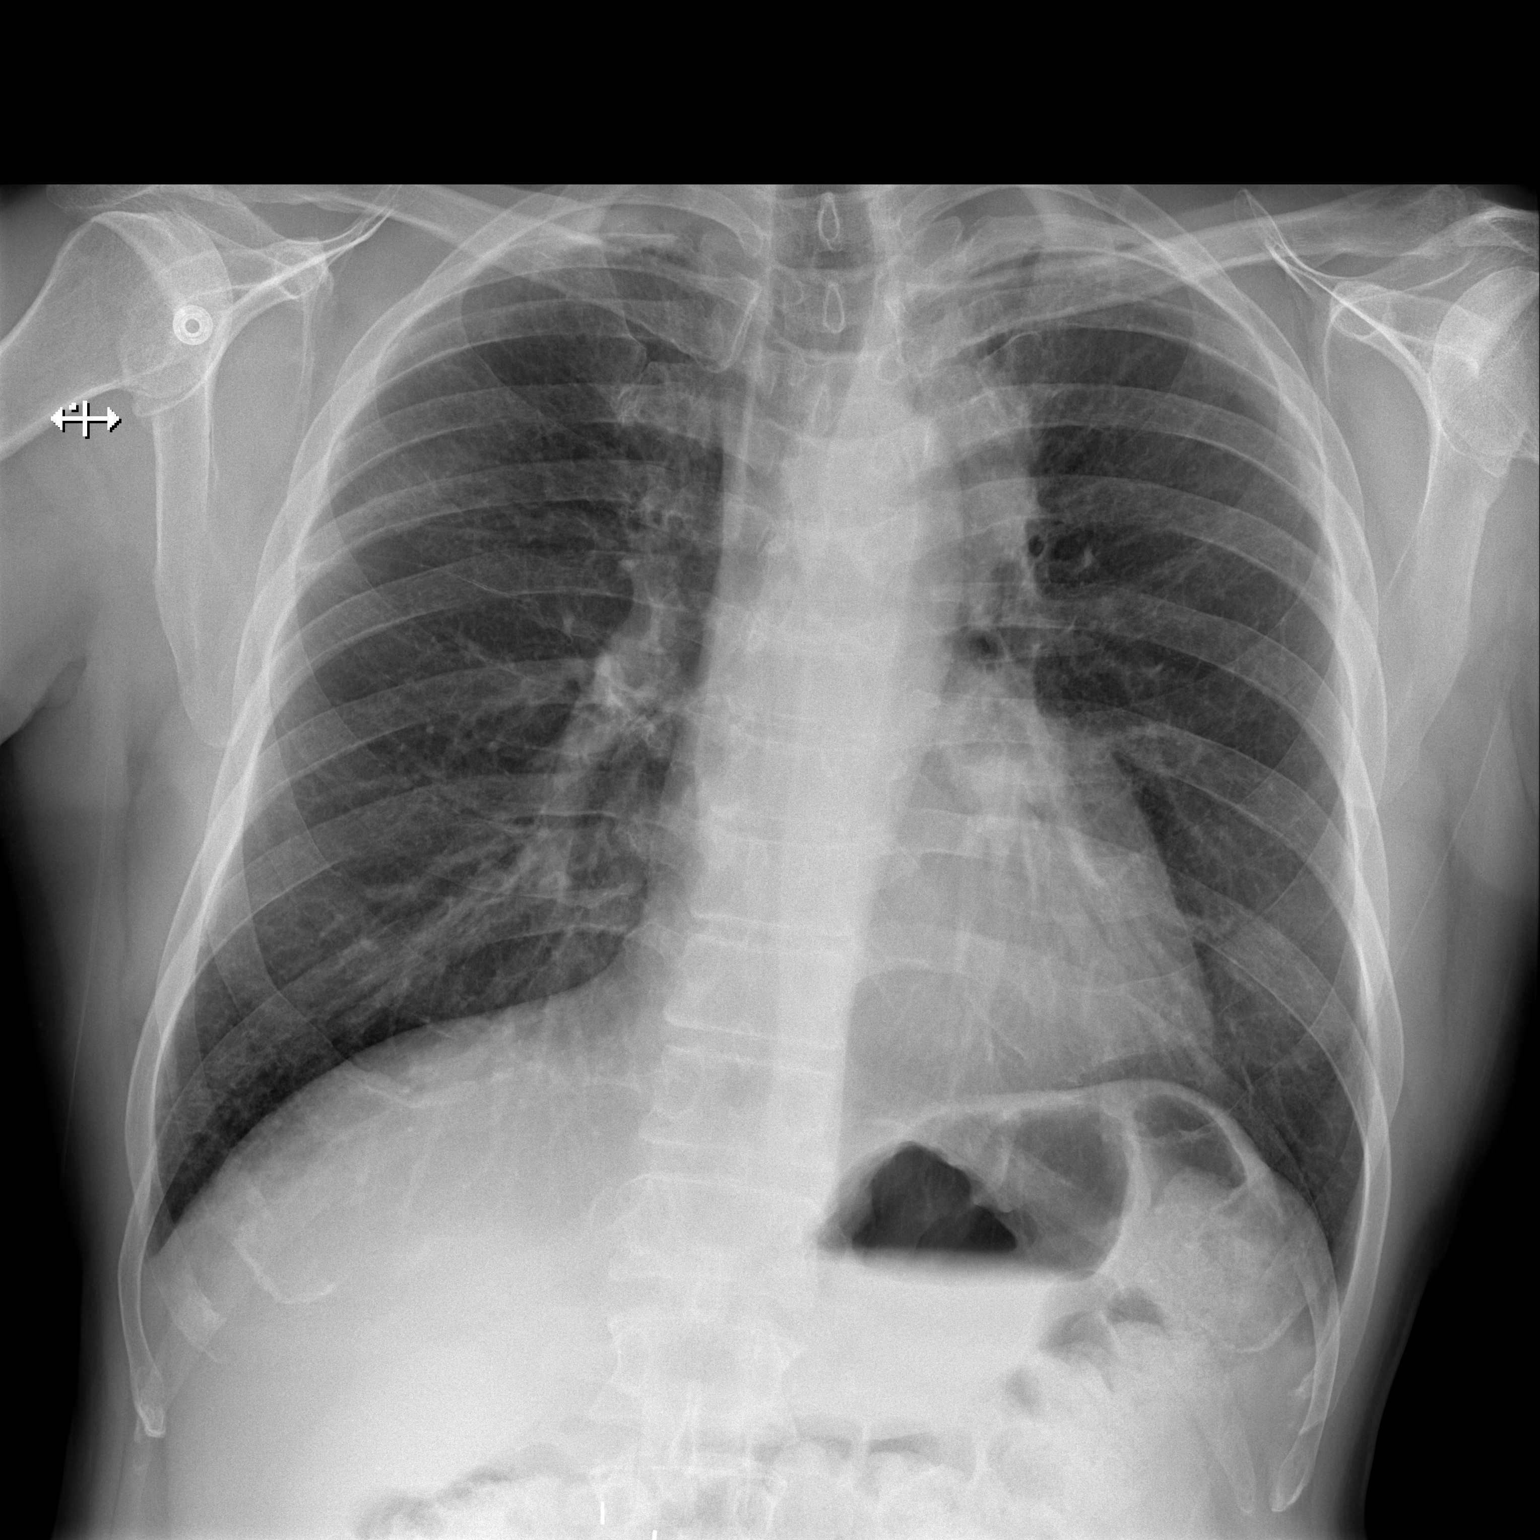

[1 of 1 positions shown; findings below may reference images not displayed]

FINDINGS: Left upper lobe mass lesion again noted. Negative for pneumothorax
post biopsy.

Negative for heart failure. No effusion. Apical pleural scarring
bilaterally.
IMPRESSION: Negative for pneumothorax post left upper lobe biopsy.

## 2014-09-17 IMAGING — US IR FLUORO GUIDE CV LINE*R*
1 series · 1 of 1 positions shown · non-contrast
Comparison: none

CLINICAL DATA: Metastatic squamous cell carcinoma.

[Series 1: ir fluoro guide cv line*right* · 1 of 1 slices shown]
[im 1/1]
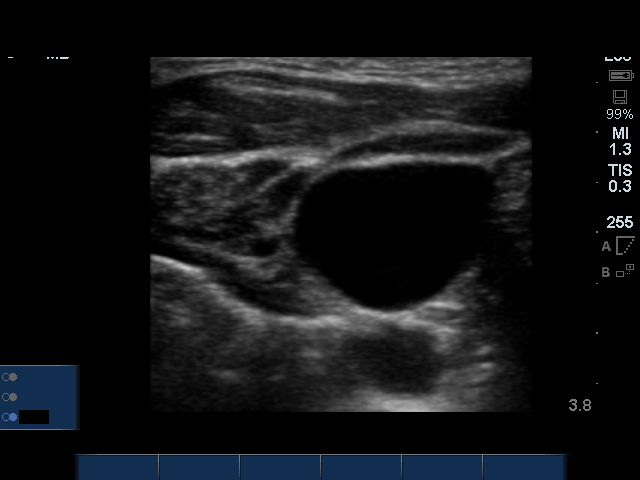

[1 of 1 positions shown; findings below may reference images not displayed]

EXAM:
FLUOROSCOPIC AND ULTRASOUND GUIDED PLACEMENT OF A SUBCUTANEOUS PORT.

MEDICATIONS AND MEDICAL HISTORY:
Ancef 2 gm. Versed 3 mg, fentanyl 150 mcg. A radiology nurse
monitored the patient for moderate sedation. As antibiotic
prophylaxis, Ancef was ordered pre-procedure and administered
intravenously within one hour of incision.

ANESTHESIA/SEDATION:
Moderate sedation time:  minutes

FLUOROSCOPY TIME:  18 seconds

PROCEDURE:
The risks of the procedure were explained to the patient. Informed
consent was obtained. Patient was placed supine on the
interventional table. Ultrasound confirmed a patent right internal
jugular vein. The right chest and neck were cleaned with a skin
antiseptic and a sterile drape was placed. Maximal barrier sterile
technique was utilized including caps, mask, sterile gowns, sterile
gloves, sterile drape, hand hygiene and skin antiseptic. The right
neck was anesthetized with 1% lidocaine. Small incision was made in
the right neck with a blade. Micropuncture set was placed in the
right internal jugular vein with ultrasound guidance. The
micropuncture wire was used for measurement purposes. The right
chest was anesthetized with 1% lidocaine with epinephrine. #15 blade
was used to make an incision and a subcutaneous port pocket was
formed. 8 french Power Port was assembled. Subcutaneous tunnel was
formed with a stiff tunneling device. The port catheter was brought
through the subcutaneous tunnel. The port was placed in the
subcutaneous pocket. The micropuncture set was exchanged for a
peel-away sheath. The catheter was placed through the peel-away
sheath and the tip was positioned in the lower SVC. Catheter
placement was confirmed with fluoroscopy. The port was accessed and
flushed with heparinized saline. The port pocket was closed using
two layers of absorbable sutures and Dermabond. The vein skin site
was closed using a single layer of absorbable suture and Dermabond.
Sterile dressings were applied. Patient tolerated the procedure well
without an immediate complication. Ultrasound and fluoroscopic
images were taken and saved for this procedure.

COMPLICATIONS:
None
IMPRESSION: Placement of a subcutaneous port device. The catheter tip is in the
lower SVC and ready to be used.

## 2014-09-25 ENCOUNTER — Other Ambulatory Visit: Payer: Self-pay

## 2018-08-09 ENCOUNTER — Encounter: Payer: Self-pay | Admitting: Gastroenterology

## 2018-08-13 ENCOUNTER — Telehealth: Payer: Self-pay | Admitting: Gastroenterology

## 2018-08-13 NOTE — Telephone Encounter (Signed)
Tried calling Gary Sexton to schedule Colonoscopy that is due. Spoke to daughter on the phone. She says Gary Sexton passed about five years ago. She did not know the exact date.
